# Patient Record
Sex: Female | Born: 1935 | Race: White | Hispanic: No | Smoking: Former smoker
Health system: Southern US, Community
[De-identification: ages and names within clinical notes are randomized; demographics above are authoritative.]

## PROBLEM LIST (undated history)

## (undated) DIAGNOSIS — Z972 Presence of dental prosthetic device (complete) (partial): Secondary | ICD-10-CM

## (undated) DIAGNOSIS — I10 Essential (primary) hypertension: Secondary | ICD-10-CM

## (undated) DIAGNOSIS — I4891 Unspecified atrial fibrillation: Secondary | ICD-10-CM

## (undated) DIAGNOSIS — Z9621 Cochlear implant status: Secondary | ICD-10-CM

## (undated) DIAGNOSIS — I495 Sick sinus syndrome: Secondary | ICD-10-CM

## (undated) DIAGNOSIS — Z950 Presence of cardiac pacemaker: Secondary | ICD-10-CM

## (undated) HISTORY — PX: PACEMAKER INSERTION: SHX728

## (undated) HISTORY — PX: COCHLEAR IMPLANT: SUR684

## (undated) HISTORY — PX: ABDOMINAL HYSTERECTOMY: SHX81

---

## 2011-03-11 ENCOUNTER — Other Ambulatory Visit: Payer: Self-pay

## 2011-03-30 ENCOUNTER — Other Ambulatory Visit: Payer: Self-pay

## 2012-11-06 ENCOUNTER — Ambulatory Visit: Payer: Self-pay | Admitting: Nephrology

## 2013-05-06 ENCOUNTER — Inpatient Hospital Stay: Payer: Self-pay | Admitting: Internal Medicine

## 2013-05-06 LAB — URINALYSIS, COMPLETE
BILIRUBIN, UR: NEGATIVE
BLOOD: NEGATIVE
Glucose,UR: NEGATIVE mg/dL (ref 0–75)
Ketone: NEGATIVE
Nitrite: POSITIVE
Ph: 6 (ref 4.5–8.0)
Protein: 100
RBC,UR: NONE SEEN /HPF (ref 0–5)
Specific Gravity: 1.012 (ref 1.003–1.030)

## 2013-05-06 LAB — CBC
HCT: 34.9 % — AB (ref 35.0–47.0)
HGB: 11.6 g/dL — ABNORMAL LOW (ref 12.0–16.0)
MCH: 31 pg (ref 26.0–34.0)
MCHC: 33.2 g/dL (ref 32.0–36.0)
MCV: 93 fL (ref 80–100)
Platelet: 104 10*3/uL — ABNORMAL LOW (ref 150–440)
RBC: 3.74 10*6/uL — ABNORMAL LOW (ref 3.80–5.20)
RDW: 13.8 % (ref 11.5–14.5)
WBC: 6.1 10*3/uL (ref 3.6–11.0)

## 2013-05-06 LAB — BASIC METABOLIC PANEL
ANION GAP: 8 (ref 7–16)
BUN: 27 mg/dL — ABNORMAL HIGH (ref 7–18)
CHLORIDE: 99 mmol/L (ref 98–107)
Calcium, Total: 8.8 mg/dL (ref 8.5–10.1)
Co2: 27 mmol/L (ref 21–32)
Creatinine: 1.02 mg/dL (ref 0.60–1.30)
EGFR (African American): 56 — ABNORMAL LOW
EGFR (Non-African Amer.): 48 — ABNORMAL LOW
Glucose: 103 mg/dL — ABNORMAL HIGH (ref 65–99)
Osmolality: 274 (ref 275–301)
POTASSIUM: 4 mmol/L (ref 3.5–5.1)
Sodium: 134 mmol/L — ABNORMAL LOW (ref 136–145)

## 2013-05-06 LAB — HEMOGLOBIN A1C: Hemoglobin A1C: 5.2 % (ref 4.2–6.3)

## 2013-05-06 LAB — TROPONIN I: Troponin-I: <0.02 ng/mL

## 2013-05-06 LAB — TSH: Thyroid Stimulating Horm: 0.661 u[IU]/mL

## 2013-05-07 LAB — COMPREHENSIVE METABOLIC PANEL
ALBUMIN: 2.6 g/dL — AB (ref 3.4–5.0)
ALK PHOS: 38 U/L — AB
ALT: 9 U/L — AB (ref 12–78)
ANION GAP: 4 — AB (ref 7–16)
AST: 19 U/L (ref 15–37)
BUN: 24 mg/dL — ABNORMAL HIGH (ref 7–18)
Bilirubin,Total: 0.5 mg/dL (ref 0.2–1.0)
CALCIUM: 8.6 mg/dL (ref 8.5–10.1)
CHLORIDE: 103 mmol/L (ref 98–107)
CO2: 27 mmol/L (ref 21–32)
Creatinine: 0.83 mg/dL (ref 0.60–1.30)
EGFR (African American): >60
Glucose: 133 mg/dL — ABNORMAL HIGH (ref 65–99)
Osmolality: 274 (ref 275–301)
Potassium: 4.1 mmol/L (ref 3.5–5.1)
SODIUM: 134 mmol/L — AB (ref 136–145)
Total Protein: 6.2 g/dL — ABNORMAL LOW (ref 6.4–8.2)

## 2013-05-07 LAB — PROTIME-INR
INR: 6.1
INR: 5.1 — AB
Prothrombin Time: 52.2 secs — ABNORMAL HIGH (ref 11.5–14.7)
Prothrombin Time: 45.4 secs — ABNORMAL HIGH (ref 11.5–14.7)

## 2013-05-07 LAB — CBC WITH DIFFERENTIAL/PLATELET
BASOS PCT: 0.4 %
Basophil #: 0 10*3/uL (ref 0.0–0.1)
Eosinophil #: 0 10*3/uL (ref 0.0–0.7)
Eosinophil %: 0 %
HCT: 32.5 % — ABNORMAL LOW (ref 35.0–47.0)
HGB: 10.9 g/dL — ABNORMAL LOW (ref 12.0–16.0)
Lymphocyte #: 0.3 10*3/uL — ABNORMAL LOW (ref 1.0–3.6)
Lymphocyte %: 7.1 %
MCH: 31.2 pg (ref 26.0–34.0)
MCHC: 33.6 g/dL (ref 32.0–36.0)
MCV: 93 fL (ref 80–100)
Monocyte #: 0.2 x10 3/mm (ref 0.2–0.9)
Monocyte %: 4.7 %
Neutrophil #: 3.7 10*3/uL (ref 1.4–6.5)
Neutrophil %: 87.8 %
PLATELETS: 94 10*3/uL — AB (ref 150–440)
RBC: 3.5 10*6/uL — AB (ref 3.80–5.20)
RDW: 13.7 % (ref 11.5–14.5)
WBC: 4.2 10*3/uL (ref 3.6–11.0)

## 2013-05-07 LAB — CK-MB
CK-MB: 0.5 ng/mL (ref 0.5–3.6)
CK-MB: 0.6 ng/mL (ref 0.5–3.6)
CK-MB: 0.7 ng/mL (ref 0.5–3.6)

## 2013-05-07 LAB — TROPONIN I
Troponin-I: <0.02 ng/mL
Troponin-I: <0.02 ng/mL
Troponin-I: <0.02 ng/mL

## 2013-05-08 LAB — PROTIME-INR
INR: 2.5
PROTHROMBIN TIME: 26.4 s — AB (ref 11.5–14.7)

## 2013-05-08 LAB — URINE CULTURE

## 2013-05-09 LAB — PROTIME-INR
INR: 2
Prothrombin Time: 22.1 secs — ABNORMAL HIGH (ref 11.5–14.7)

## 2013-05-10 LAB — PROTIME-INR
INR: 2.9
PROTHROMBIN TIME: 29.6 s — AB (ref 11.5–14.7)

## 2013-05-17 ENCOUNTER — Emergency Department: Payer: Self-pay | Admitting: Emergency Medicine

## 2013-05-17 LAB — BASIC METABOLIC PANEL
Anion Gap: 6 — ABNORMAL LOW (ref 7–16)
BUN: 37 mg/dL — ABNORMAL HIGH (ref 7–18)
CALCIUM: 8 mg/dL — AB (ref 8.5–10.1)
Chloride: 108 mmol/L — ABNORMAL HIGH (ref 98–107)
Co2: 25 mmol/L (ref 21–32)
Creatinine: 1.29 mg/dL (ref 0.60–1.30)
EGFR (African American): 42 — ABNORMAL LOW
GFR CALC NON AF AMER: 36 — AB
GLUCOSE: 117 mg/dL — AB (ref 65–99)
Osmolality: 287 (ref 275–301)
Potassium: 4.1 mmol/L (ref 3.5–5.1)
SODIUM: 139 mmol/L (ref 136–145)

## 2013-05-17 LAB — PROTIME-INR: PROTHROMBIN TIME: 106.9 s — AB (ref 11.5–14.7)

## 2013-05-17 LAB — CBC WITH DIFFERENTIAL/PLATELET
BASOS ABS: 0.1 10*3/uL (ref 0.0–0.1)
Basophil %: 0.5 %
EOS PCT: 1.7 %
Eosinophil #: 0.2 10*3/uL (ref 0.0–0.7)
HCT: 37.2 % (ref 35.0–47.0)
HGB: 12.4 g/dL (ref 12.0–16.0)
LYMPHS ABS: 0.8 10*3/uL — AB (ref 1.0–3.6)
LYMPHS PCT: 8 %
MCH: 31.2 pg (ref 26.0–34.0)
MCHC: 33.3 g/dL (ref 32.0–36.0)
MCV: 94 fL (ref 80–100)
MONOS PCT: 12.5 %
Monocyte #: 1.2 x10 3/mm — ABNORMAL HIGH (ref 0.2–0.9)
NEUTROS ABS: 7.6 10*3/uL — AB (ref 1.4–6.5)
Neutrophil %: 77.3 %
Platelet: 172 10*3/uL (ref 150–440)
RBC: 3.96 10*6/uL (ref 3.80–5.20)
RDW: 14.5 % (ref 11.5–14.5)
WBC: 9.9 10*3/uL (ref 3.6–11.0)

## 2013-05-17 LAB — APTT: ACTIVATED PTT: 78 s — AB (ref 23.6–35.9)

## 2014-06-18 ENCOUNTER — Emergency Department: Payer: Self-pay | Admitting: Emergency Medicine

## 2014-07-20 NOTE — Discharge Summary (Signed)
PATIENT NAME:  Brandi Aguirre, Brandi Aguirre MR#:  [id number] DATE OF BIRTH:  Jan 11, 1922  DATE OF ADMISSION:  05/06/2013 DATE OF DISCHARGE:  05/10/2013  ADMITTING DIAGNOSES:  1.  Acute respiratory failure.  2.  Malignant hypertension.   DISCHARGE DIAGNOSES: 1.  Acute respiratory failure.  2.  Chronic obstructive pulmonary disease exacerbation.  3.  Bronchitis.  4.  Malnutrition.  5.  Generalized weakness.  6. Coagulopathy, acquired due to Coumadin and antibiotic use with most recent INR on 05/09/2013, of 2.9.  7.  History of hypertension, coronary artery disease, diabetes mellitus with hemoglobin A1c 5.2. 8.  History of former tobacco abuse, permanent pacemaker placement, hyperlipidemia, insomnia, questionable atrial fibrillation.  9.  Status post carotid endarterectomy, hysterectomy and cochlear implant.   DISCHARGE CONDITION: Stable.   DISCHARGE MEDICATIONS: The patient is to resume:  1.  Lofibra 134 mg p.o. daily.  2.  Hydrochlorothiazide 25 mg p.o. daily.  3.  Metformin 500 mg p.o. twice daily.  4.  Metoprolol  100 mg p.o. twice daily. 5.  Warfarin 5 mg with additional 1/2 tablet, which would be 7.5 mg total for Mondays, Wednesdays and Fridays; 5 mg all other days, which would be on Tuesdays, Thursdays, Saturdays and Sundays.  5.  Diltiazem extended release 240 mg p.o. twice a day.  6.  Trazodone 100 mg p.o. once daily at bedtime.  7.  Hydralazine 100 mg p.o. 3 times daily.  8.  Isosorbide mononitrate 60 mg p.o. once daily.  9.  Lisinopril 40 mg p.o. daily.  10.  Prednisone 40 mg p.o. once, then taper by 10 mg daily until stopped.  11.  Eszopiclone 2 mg p.o. at bedtime as needed.  12.  Albuterol/ipratropium 2.5/0.5 mg in 3 mL inhalation solution, 1 inhaler every 6 hours as needed.  13.  Fluticasone/formoterol 250/50, 1 puff twice daily.  14.  Senna 1 tablet twice daily as needed.  15.  Docusate sodium 100 mg p.o. twice daily as needed.  16.  Levofloxacin 750 mg p.o. every 48 hours x 3 days.    HOME OXYGEN: None.   DIET: Two-gram salt, low-fat, low-cholesterol, carbohydrate-controlled diet, regular consistency.   ACTIVITY LIMITATIONS: As tolerated.     FOLLOWUP: Appointment with Dr. Zada Finderslmedo n 2 days after discharge. Also, Dr. Belia HemanKasa, pulmonary.   REFERRALS: To physical therapy.   CONSULTANTS: Care management, social work.   RADIOLOGIC STUDIES: Chest x-ray, PA and lateral, 0208/2015, showed nodular opacity in the right perihilar region, which warrants noncontrast enhanced CT of chest to assess it.  CT scan of chest without IV contrast, 05/06/2013, revealed no suspicious pulmonary nodularity. There was small subpleural ground-glass density in left upper lobe of doubtful clinical significance, according to radiologist. Also diffuse atherosclerosis, but no acute findings demonstrated.   HOSPITAL COURSE: The patient is a 81 year old Caucasian female with past medical history significant for history of multiple medical problems including diabetes, hypertension, Afib, who presented to the hospital with significant fatigue, as well as dry cough and shortness of breath. Please refer to Dr. Arlys JohnVaickute's admission note on 05/06/2013. On arrival to the Emergency Room, the patient's temperature was 98.7, pulse was 60, respirations were 24, blood pressure 162/97. O2 sats were 88% on room air at rest. Physical exam revealed dry crackles bilaterally, but no significant rales, rhonchi or wheezing noted. No labored inspirations or increased effort.  No  dullness to percussion, not in overt respiratory distress. The patient's lab data done in the Emergency Room revealed mildly low sodium at 134 and elevation  of BUN to 27, glucose of 103. The patient's hemoglobin A1c was checked and was found to be 5.2. Liver enzymes were unremarkable, except albumin level of 2.6. Cardiac enzymes x 4 were within normal limits. TSH was normal at 0.661. White blood cell count was normal at 6.1. Hemoglobin was 11.6 and platelet  count was 104. Coagulation panel revealed pro time of 52.2, INR was 6.1 and d-dimer was elevated at 1.46. Urinalysis revealed 5 to 15 white blood cells. Urine culture, however, revealed mixed bacterial organisms, results suggestive of contamination.  EKG showed left ventricular pacemaker at 60 beats per minute. No acute ST-T changes were noted. The patient was admitted to the hospital for further evaluation. She was hydrated. She was initiated on antibiotics, steroids, inhalation therapy for her shortness of breath because of concern of possible COPD exacerbation and pneumonia. She was seen by Dr. Belia Heman, pulmonologist, who followed her along. Dr. Belia Heman felt that very likely the patient had bronchitis with probable COPD exacerbation. He also felt that if cough persists more than for several weeks, we may need to find an alternative for ACE inhibitor. He recommended to continue oxygen therapy to keep oxygenation above 88%, continue Advair, albuterol, check for influenza testing and wean off steroids. The patient was continued on this current therapy, including Advair, nebulizers, antibiotic therapy and steroid taper. She did well, and she was weaned off oxygen therapy. By the day of discharge, 05/10/2013, her O2 sats were 93% on room air at rest. The patient is to continue steroid taper, as well as antibiotic therapy to complete a 7 day course. She is to follow up with her primary care physician for further recommendations. In regards to hypertension, coronary artery disease, they were stable. The patient is to continue her outpatient management. For diabetes mellitus, the patient's hemoglobin A1c is low at 5.2. It is recommended to follow her blood glucose levels very closely, as well as her oral intake, since she is back on Glucophage. The patient would benefit from nutritional supplements overall due to her malnutrition. In regards to weakness, the patient was evaluated by rehabilitation, by rehab physical therapist,  who recommended to discharge her to skilled nursing facility for rehabilitation. The patient will be discharged to [facility] according to her request today on 05/10/2013. On the day of discharge, temperature is 98.8, pulse was 62, respiration rate was 18 to 20, blood pressure ranging from 118 to 148 systolic and 50s to 60s diastolic. O2 sats were 92% to 93% on room air at rest.   TIME SPENT: 40 minutes.   Again, the patient's pro time was checked on 05/10/2013, was 2.9.   ____________________________ Katharina Caper, MD rv:dmm D: 05/10/2013 12:48:00 ET T: 05/10/2013 13:27:08 ET JOB#: [id number]  cc: Katharina Caper, MD, <Dictator> Dione Housekeeper, MD Kearney Evitt MD ELECTRONICALLY SIGNED 05/29/2013 21:13

## 2014-07-20 NOTE — H&P (Signed)
PATIENT NAME:  Brandi PoagWINNER, Brandi Aguirre MR#:  [id number] DATE OF BIRTH:  [birth year]  DATE OF ADMISSION:  05/06/2013  PRIMARY CARE PHYSICIAN: Dr.   The patient is a 81 years old Caucasian female with past medical history significant for history of hypertension, history of diabetes, questionable atrial fibrillation, who presented to the hospital with complaints of increasing fatigue over the past one week, as well as dry cough, as well as shortness of breath. On arrival to the Emergency Room, she was noted to have oxygen saturation of 88% on room air and she was also noted to be wheezing. Patient admitted not lifting up much   phlegm; had no fevers or chills. In the Emergency Room her chest x-ray was somewhat concerning for possible nodular opacity in the right perihilar region and CT scan of chest was performed; however, that did not reveal any abnormalities in the right perihilar area, but showed small subdural groundglass density in left upper lobe. Since the patient was hypoxic, hospital services were contacted for admission.   PAST MEDICAL HISTORY: Significant for history of former tobacco abuse, hypertension, permanent pacemaker placement, history of hyperlipidemia, diabetes mellitus, insomnia for which she takes trazodone, questionable atrial fibrillation carotid endarterectomy, hysterectomy, also cochlear implant. Diabetes mellitus as mentioned above.   MEDICATIONS: Diltiazem 240 mg p.o. twice daily, fenofibrate 134 mg once daily, hydrochlorothiazide 25 mg p.o. daily, lisinopril 40 mg p.o. daily, metformin 500 mg p.o. twice daily, metoprolol tartrate 100 mg p.o. twice daily, trazodone 100 mg p.o. in the evening, warfarin alternating doses 7.5 mg given on Wednesdays as well as Fridays and 5 mg all other days. Hydralazine 100 mg 3 times daily and Imdur 60 mg p.o. daily.   ALLERGIES: SULFA AS WELL AS NORVASC WHICH GIVES HER LOWER EXTREMITY SWELLING.  PAST SURGICAL HISTORY:  As above, others are not available.  Also cochlea implant. Very little is available since the patient has difficulty hearing.   FAMILY HISTORY: Hypertension, hyperlipidemia as well as diabetes mellitus in the patient's son. The patient's father died at age of 79. The patient's mother had arthritis.   SOCIAL HISTORY: The patient is widowed since 2012.  [place], [place] to [place]. No smoking. No alcohol abuse;  however, the patient did smoke in the past 2 packs per day for which she says forever since teenage years, quit in 1987. 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   REVIEW OF SYSTEMS: Difficult to obtain as the patient has very poor hearing. She denies any chest pains. Admits of shortness of breath, as well as cough and denies any abdominal pains.   PHYSICAL EXAMINATION: VITAL SIGNS: On arrival to the Emergency Room, the patient's temperature is 98.7, pulse was 60, respirations were 24, blood pressure 162/97, saturation was 88% on room air. IN GENERAL:  This is a well-developed, well-nourished Caucasian female in no significant distress lying on the stretcher.  HEENT: Pupils are equal, reactive to light. Extraocular movements intact. The patient has significant difficulty hearing. She has cochlear implant. No conjunctivitis. No pharyngeal erythema. Mucosa is very dry. Lips are scaled.  NECK: There is no masses. Supple, nontender. Thyroid is not enlarged. No adenopathy. No JVD or carotid bruits bilaterally. Full range of motion.  LUNGS: Dry crackles were heard bilaterally. No significant rales, rhonchi or wheezing were noted. No labored inspirations, increased effort. No dullness to percussion. Not in overt respiratory distress.  CARDIOVASCULAR: S1, S2 appreciable, cracles were  noted on the right. Chest is nontender to palpation.  EXTREMITIES: No lower extremity edema, calf tenderness or cyanosis was noted.  ABDOMEN: Soft, nontender.  Bowel sounds are present. No hepatosplenomegaly or masses were noted.  RECTAL: Deferred.  MUSCLE STRENGTH: Able to move all extremities. No cyanosis, degenerative joint disease. The patient does have kyphosis and very weak. She is not able to get up from the lying position to sitting position.  SKIN: She has no rashes, lesions, erythema, nodularity or induration. Was warm and dry to palpation.  LYMPHATIC: No adenopathy in the cervical region.  NEUROLOGIC: Cranial nerves grossly intact except hearing loss. Sensory otherwise is intact. No dysarthria or aphasia. The patient is alert, oriented to person and place as well as time. She is cooperative. Memory is good.  PSYCHIATRIC: No significant confusion, agitation or depression was noted.   LABORATORIES: BMP showed elevation of BUN to 27, sodium 134, glucose 103, otherwise BMP was unremarkable. Troponin level less than 0.02. White blood cell count was normal at 6.1, hemoglobin 11.6, platelet count 104. Urinalysis revealed yellow hazy urine, negative for glucose, bilirubin or ketones. Specific gravity was 1.012, pH was 6.0, negative for blood, 100 mg/dL protein, positive for nitrites, trace leukocyte esterase, no red blood cells, 5 to 15 white blood cells, 4+ bacteria, 5 to 15 epithelial cells, as well as 0 to 5 transitional epithelial cells.   Chest  x-rays PA and lateral  revealed nodular opacity in the right perihilar region, for which was noncontrast enhanced CT of chest for further evaluation recommended by radiology, otherwise clear lungs everywhere else. Extensive atherosclerotic changes were noted in the aorta according to radiologist. CT scan of the chest without contrast, 05/06/2013 reveals no suspicious pulmonary nodularity, subpleural groundglass density in the left upper lobe of doubtful clinical significance, diffuse atherosclerosis. No acute findings were demonstrated.   ASSESSMENT AND PLAN: 1. Hypoxia of unclear etiology at this time,  questionable pneumonia. We will initiate the patient on Levaquin IV, as well as steroids and DuoNeb. Due to her extensive history of smoking we will get a pulmonary consultation as well. We will get d-dimer and get CT scan with contrast if d-dimer is up, or  VQ scan.  2. Hypertension. We will resume blood pressure medications.  3. Diabetes mellitus will get hemoglobin A1c. We will continue home medications.  4. Mild dehydration. We will start the patient on low rate IV fluids.  5. Pyuria, likley urinary tract infection. Get cultures and continue Levaquin.  6. Generalized weakness. Continue the patient with physical therapy.  TIME SPENT: 50 minutes.    ____________________________ Katharina Caper, MD rv:sg D: 05/06/2013 22:06:00 ET T: 05/07/2013 07:33:09 ET JOB#: [id number]  cc: Katharina Caper, MD, <Dictator>    Raynee Mccasland MD ELECTRONICALLY SIGNED 05/30/2013 20:57

## 2015-09-04 ENCOUNTER — Inpatient Hospital Stay
Admission: EM | Admit: 2015-09-04 | Discharge: 2015-09-07 | DRG: 291 | Disposition: A | Discharge status: Home / Self Care | Payer: Medicare Other | Attending: Internal Medicine | Admitting: Internal Medicine

## 2015-09-04 ENCOUNTER — Emergency Department: Payer: Medicare Other

## 2015-09-04 ENCOUNTER — Inpatient Hospital Stay
Admit: 2015-09-04 | Discharge: 2015-09-04 | Disposition: A | Discharge status: Home / Self Care | Payer: Medicare Other | Attending: Family Medicine | Admitting: Family Medicine

## 2015-09-04 DIAGNOSIS — Z87891 Personal history of nicotine dependence: Secondary | ICD-10-CM | POA: Diagnosis not present

## 2015-09-04 DIAGNOSIS — J9601 Acute respiratory failure with hypoxia: Secondary | ICD-10-CM | POA: Diagnosis present

## 2015-09-04 DIAGNOSIS — I4891 Unspecified atrial fibrillation: Secondary | ICD-10-CM

## 2015-09-04 DIAGNOSIS — Z950 Presence of cardiac pacemaker: Secondary | ICD-10-CM

## 2015-09-04 DIAGNOSIS — I509 Heart failure, unspecified: Secondary | ICD-10-CM | POA: Diagnosis present

## 2015-09-04 DIAGNOSIS — H9190 Unspecified hearing loss, unspecified ear: Secondary | ICD-10-CM | POA: Diagnosis present

## 2015-09-04 DIAGNOSIS — I5041 Acute combined systolic (congestive) and diastolic (congestive) heart failure: Secondary | ICD-10-CM | POA: Diagnosis present

## 2015-09-04 DIAGNOSIS — Z79899 Other long term (current) drug therapy: Secondary | ICD-10-CM

## 2015-09-04 DIAGNOSIS — I248 Other forms of acute ischemic heart disease: Secondary | ICD-10-CM | POA: Diagnosis present

## 2015-09-04 DIAGNOSIS — I959 Hypotension, unspecified: Secondary | ICD-10-CM | POA: Diagnosis present

## 2015-09-04 DIAGNOSIS — E785 Hyperlipidemia, unspecified: Secondary | ICD-10-CM | POA: Diagnosis present

## 2015-09-04 DIAGNOSIS — Z9621 Cochlear implant status: Secondary | ICD-10-CM | POA: Diagnosis present

## 2015-09-04 DIAGNOSIS — I10 Essential (primary) hypertension: Secondary | ICD-10-CM

## 2015-09-04 DIAGNOSIS — Z7901 Long term (current) use of anticoagulants: Secondary | ICD-10-CM

## 2015-09-04 DIAGNOSIS — I110 Hypertensive heart disease with heart failure: Secondary | ICD-10-CM | POA: Diagnosis present

## 2015-09-04 LAB — BASIC METABOLIC PANEL
ANION GAP: 8 (ref 5–15)
BUN: 31 mg/dL — AB (ref 6–20)
CALCIUM: 8.6 mg/dL — AB (ref 8.9–10.3)
CO2: 26 mmol/L (ref 22–32)
Chloride: 101 mmol/L (ref 101–111)
Creatinine, Ser: 1.01 mg/dL — ABNORMAL HIGH (ref 0.44–1.00)
GFR calc Af Amer: 54 mL/min — ABNORMAL LOW (ref >60)
GFR, EST NON AFRICAN AMERICAN: 47 mL/min — AB (ref >60)
GLUCOSE: 203 mg/dL — AB (ref 65–99)
POTASSIUM: 4.4 mmol/L (ref 3.5–5.1)
SODIUM: 135 mmol/L (ref 135–145)

## 2015-09-04 LAB — CBC
HCT: 32.6 % — ABNORMAL LOW (ref 35.0–47.0)
HEMOGLOBIN: 10.9 g/dL — AB (ref 12.0–16.0)
MCH: 30.3 pg (ref 26.0–34.0)
MCHC: 33.4 g/dL (ref 32.0–36.0)
MCV: 90.9 fL (ref 80.0–100.0)
Platelets: 149 10*3/uL — ABNORMAL LOW (ref 150–440)
RBC: 3.59 MIL/uL — ABNORMAL LOW (ref 3.80–5.20)
RDW: 13.9 % (ref 11.5–14.5)
WBC: 9.8 10*3/uL (ref 3.6–11.0)

## 2015-09-04 LAB — PROTIME-INR
INR: 2.12
Prothrombin Time: 23.6 seconds — ABNORMAL HIGH (ref 11.4–15.0)

## 2015-09-04 LAB — TROPONIN I
TROPONIN I: 0.28 ng/mL — AB (ref <0.031)
Troponin I: 1.38 ng/mL — ABNORMAL HIGH (ref <0.031)
Troponin I: 2.32 ng/mL — ABNORMAL HIGH (ref <0.031)
Troponin I: 3.29 ng/mL — ABNORMAL HIGH (ref <0.031)

## 2015-09-04 LAB — FIBRIN DERIVATIVES D-DIMER (ARMC ONLY): FIBRIN DERIVATIVES D-DIMER (ARMC): 991 — AB (ref 0–499)

## 2015-09-04 LAB — TSH: TSH: 2.093 u[IU]/mL (ref 0.350–4.500)

## 2015-09-04 MED ORDER — SENNOSIDES-DOCUSATE SODIUM 8.6-50 MG PO TABS
1.0000 | ORAL_TABLET | Freq: Every evening | ORAL | Status: DC | PRN
  Administered 2015-09-06: 1 via ORAL
  Filled 2015-09-04: qty 1

## 2015-09-04 MED ORDER — ENOXAPARIN SODIUM 40 MG/0.4ML ~~LOC~~ SOLN
40.0000 mg | SUBCUTANEOUS | Status: DC
  Administered 2015-09-04: 40 mg via SUBCUTANEOUS
  Filled 2015-09-04: qty 0.4

## 2015-09-04 MED ORDER — METOPROLOL TARTRATE 100 MG PO TABS
100.0000 mg | ORAL_TABLET | Freq: Two times a day (BID) | ORAL | Status: DC
Start: 2015-09-04 — End: 2015-09-06
  Administered 2015-09-04: 100 mg via ORAL
  Filled 2015-09-04 (×2): qty 1

## 2015-09-04 MED ORDER — ENSURE ENLIVE PO LIQD
237.0000 mL | Freq: Three times a day (TID) | ORAL | Status: DC
  Administered 2015-09-04 – 2015-09-07 (×8): 237 mL via ORAL

## 2015-09-04 MED ORDER — FUROSEMIDE 10 MG/ML IJ SOLN
INTRAMUSCULAR | Status: AC
  Administered 2015-09-04: 40 mg via INTRAVENOUS
  Filled 2015-09-04: qty 4

## 2015-09-04 MED ORDER — ONDANSETRON HCL 4 MG/2ML IJ SOLN: 4.0000 mg | Freq: Four times a day (QID) | INTRAMUSCULAR | Status: DC | PRN

## 2015-09-04 MED ORDER — DILTIAZEM HCL 60 MG PO TABS: 240.0000 mg | ORAL_TABLET | Freq: Every day | ORAL | Status: DC

## 2015-09-04 MED ORDER — IPRATROPIUM-ALBUTEROL 0.5-2.5 (3) MG/3ML IN SOLN
RESPIRATORY_TRACT | Status: AC
  Administered 2015-09-04: 06:00:00
  Filled 2015-09-04: qty 3

## 2015-09-04 MED ORDER — LISINOPRIL 20 MG PO TABS
40.0000 mg | ORAL_TABLET | Freq: Every day | ORAL | Status: DC
  Administered 2015-09-04: 40 mg via ORAL
  Filled 2015-09-04: qty 2

## 2015-09-04 MED ORDER — ISOSORBIDE MONONITRATE ER 60 MG PO TB24
60.0000 mg | ORAL_TABLET | Freq: Every day | ORAL | Status: DC
  Administered 2015-09-04 – 2015-09-07 (×3): 60 mg via ORAL
  Filled 2015-09-04 (×3): qty 1

## 2015-09-04 MED ORDER — ACETAMINOPHEN 650 MG RE SUPP: 650.0000 mg | Freq: Four times a day (QID) | RECTAL | Status: DC | PRN

## 2015-09-04 MED ORDER — SODIUM CHLORIDE 0.9% FLUSH
3.0000 mL | Freq: Two times a day (BID) | INTRAVENOUS | Status: DC
  Administered 2015-09-04 – 2015-09-06 (×6): 3 mL via INTRAVENOUS

## 2015-09-04 MED ORDER — FUROSEMIDE 10 MG/ML IJ SOLN
40.0000 mg | Freq: Two times a day (BID) | INTRAMUSCULAR | Status: DC
  Administered 2015-09-04: 40 mg via INTRAVENOUS
  Filled 2015-09-04: qty 4

## 2015-09-04 MED ORDER — WARFARIN SODIUM 5 MG PO TABS: 5.0000 mg | ORAL_TABLET | Freq: Every day | ORAL | Status: DC

## 2015-09-04 MED ORDER — FUROSEMIDE 10 MG/ML IJ SOLN
40.0000 mg | Freq: Once | INTRAMUSCULAR | Status: AC
  Administered 2015-09-04: 40 mg via INTRAVENOUS

## 2015-09-04 MED ORDER — DILTIAZEM HCL ER COATED BEADS 240 MG PO CP24
240.0000 mg | ORAL_CAPSULE | Freq: Every day | ORAL | Status: DC
  Administered 2015-09-04 – 2015-09-07 (×3): 240 mg via ORAL
  Filled 2015-09-04 (×3): qty 1

## 2015-09-04 MED ORDER — TRAZODONE HCL 100 MG PO TABS
100.0000 mg | ORAL_TABLET | Freq: Every day | ORAL | Status: DC
  Administered 2015-09-04 – 2015-09-06 (×2): 100 mg via ORAL
  Filled 2015-09-04 (×4): qty 1

## 2015-09-04 MED ORDER — ENOXAPARIN SODIUM 60 MG/0.6ML ~~LOC~~ SOLN
1.0000 mg/kg | SUBCUTANEOUS | Status: DC
  Administered 2015-09-05: 55 mg via SUBCUTANEOUS
  Filled 2015-09-04: qty 0.6

## 2015-09-04 MED ORDER — HYDRALAZINE HCL 50 MG PO TABS
100.0000 mg | ORAL_TABLET | Freq: Three times a day (TID) | ORAL | Status: DC
  Administered 2015-09-04 – 2015-09-05 (×3): 100 mg via ORAL
  Filled 2015-09-04 (×4): qty 2

## 2015-09-04 MED ORDER — ONDANSETRON HCL 4 MG PO TABS: 4.0000 mg | ORAL_TABLET | Freq: Four times a day (QID) | ORAL | Status: DC | PRN

## 2015-09-04 MED ORDER — ACETAMINOPHEN 325 MG PO TABS: 650.0000 mg | ORAL_TABLET | Freq: Four times a day (QID) | ORAL | Status: DC | PRN

## 2015-09-04 MED ORDER — ASPIRIN EC 81 MG PO TBEC
81.0000 mg | DELAYED_RELEASE_TABLET | Freq: Every day | ORAL | Status: DC
  Administered 2015-09-04 – 2015-09-07 (×4): 81 mg via ORAL
  Filled 2015-09-04 (×4): qty 1

## 2015-09-04 NOTE — Progress Notes (Signed)
82yo wf admitted to room 246 via stretcher from ED with CHF exacerbation.  A&O x 3.  No distress on 2LO2 per .  Pt very HOH, has bilateral cochlear implants.  Cardiac monitor placed on pt and verified.  Denies chest pain.  Lungs diminished bil.  Abdomen benign. Skin assessment with bruises noted on bil arms and lt knee, skin checked with Crystal G. RN.  Oriented to room and surroundings, POC reviewed with pt and family.  Denies need at this time.  CB in reach, SR up x 2, bed alarm on.

## 2015-09-04 NOTE — H&P (Signed)
PCP:   [facility]   Chief Complaint:  Shortness of breath  HPI: This is a 82 year old female who states she woke up at midnight with sudden onset of shortness of breath. She states her SOB was rather marked and the only way she could get some relief was with standing. She denies any chest pains. She states she's never had this before. She denies any lower extremity edema. She states she's been coughing more and she has a mild wheeze. She states her cough is nonproductive. She is not on home oxygen. she has no history of myocardial infarction but she does have a pacemaker and a history of atrial fibrillation. She's never had episodes of congestive heart failure in the past. She called her family and they brought her to the ER. She feels much better after Lasix administration. Patient's is very hard of hearing.  Review of Systems:  The patient denies anorexia, fever, weight loss,, vision loss, decreased hearing, hoarseness, chest pain, syncope, dyspnea on exertion, peripheral edema, balance deficits, hemoptysis, abdominal pain, melena, hematochezia, severe indigestion/heartburn, hematuria, incontinence, genital sores, muscle weakness, suspicious skin lesions, transient blindness, difficulty walking, depression, unusual weight change, abnormal bleeding, enlarged lymph nodes, angioedema, and breast masses.  Past Medical History: History reviewed. No pertinent past medical history. Past Surgical History  Procedure Laterality Date  . Pacemaker insertion    . Cochlear implant      Medications: Prior to Admission medications   Not on File    Allergies:  No Known Allergies  Social History:  reports that she has quit smoking. She does not have any smokeless tobacco history on file. Her alcohol and drug histories are not on file.  Family History: History reviewed. No pertinent family history.  Physical Exam: Filed Vitals:   09/04/15 0540 09/04/15 0550 09/04/15 0621  BP:   130/67 143/66  Pulse:  78 82  Temp:  97.7 F (36.5 C)   TempSrc:  Oral   Resp:  19 17  SpO2: 86% 97% 93%    General:  Alert and oriented times three, well developed and nourished, no acute distress Eyes: PERRLA, pink conjunctiva, no scleral icterus ENT: Moist oral mucosa, neck supple, no thyromegaly Lungs: clear to ascultation, no wheeze, mild anterior crackles, no use of accessory muscles Cardiovascular: regular rate and rhythm, no regurgitation, no gallops, no murmurs. No carotid bruits, no JVD Abdomen: soft, positive BS, non-tender, non-distended, no organomegaly, not an acute abdomen GU: not examined Neuro: CN II - XII grossly intact, sensation intact Musculoskeletal: strength 5/5 all extremities, no clubbing, cyanosis or edema Skin: no rash, no subcutaneous crepitation, no decubitus Psych: appropriate patient   Labs on Admission:   Recent Labs  09/04/15 0538  NA 135  K 4.4  CL 101  CO2 26  GLUCOSE 203*  BUN 31*  CREATININE 1.01*  CALCIUM 8.6*   No results for input(s): AST, ALT, ALKPHOS, BILITOT, PROT, ALBUMIN in the last 72 hours. No results for input(s): LIPASE, AMYLASE in the last 72 hours.  Recent Labs  09/04/15 0538  WBC 9.8  HGB 10.9*  HCT 32.6*  MCV 90.9  PLT 149*    Recent Labs  09/04/15 0538  TROPONINI 0.28*   Invalid input(s): POCBNP No results for input(s): DDIMER in the last 72 hours. No results for input(s): HGBA1C in the last 72 hours. No results for input(s): CHOL, HDL, LDLCALC, TRIG, CHOLHDL, LDLDIRECT in the last 72 hours. No results for input(s): TSH, T4TOTAL, T3FREE, THYROIDAB in the  last 72 hours.  Invalid input(s): FREET3 No results for input(s): VITAMINB12, FOLATE, FERRITIN, TIBC, IRON, RETICCTPCT in the last 72 hours.  Micro Results: No results found for this or any previous visit (from the past 240 hour(s)).   Radiological Exams on Admission: Dg Chest Port 1 View  09/04/2015  CLINICAL DATA:  Dyspnea and hypoxia EXAM:  PORTABLE CHEST 1 VIEW COMPARISON:  06/18/2014 FINDINGS: Chronic cardiopericardial enlargement. Stable aortic and hilar contours. Dual-chamber pacer from the right in stable position. New diffuse interstitial opacity with Kerley lines. IMPRESSION: CHF pattern. Electronically Signed   By: Marnee Spring M.D.   On: 09/04/2015 06:12    Assessment/Plan Present on Admission:  New-onset congestive heart failure -Admit to med telemetry -IV Lasix every 12 hours -Strict I's and O's, daily weights -Place Foley -2-D echo ordered -Cardiology consult requested  Mild troponin elevation -Will order 325 mg of aspirin daily and cycle enzymes. DVT dose of Lovenox for now. We'll advance if troponins continued to decrease  -Consult cardiology. Patient denies any chest pains.  Hypertension -Stable, home medications resumed  History of atrial fibrillation -Stable, now in sinus rhythm  Dyslipidemia -Stable medications resumed   Brandi Aguirre 09/04/2015, 6:38 AM

## 2015-09-04 NOTE — Progress Notes (Signed)
Pt scheduled to get hydralalize & metoprolol for tonight, pt's BP was  Filed Vitals:   09/04/15 2001 09/04/15 2133  BP: 109/44 110/38  Pulse: 64 51  Temp: 98.8 F (37.1 C)   Resp: 18    MD paged to see whether to hold these meds or to give them, Dr. Joneen Roachrosley states to hold both medications. Will continue to monitor. Shirley FriarAlexis Miller, RN

## 2015-09-04 NOTE — Progress Notes (Signed)
Dr. Clint GuyHower made aware of pt's second troponin of 1.38

## 2015-09-04 NOTE — Plan of Care (Signed)
Problem: Safety: Goal: Ability to remain free from injury will improve Outcome: Progressing Fall precautions in place  Problem: Tissue Perfusion: Goal: Risk factors for ineffective tissue perfusion will decrease Outcome: Progressing Coumadin

## 2015-09-04 NOTE — Progress Notes (Signed)
Spoke with Dr. Lady GaryFath re: + troponins, will be up to see pt shortly.

## 2015-09-04 NOTE — Consult Note (Signed)
[facility] CLINIC CARDIOLOGY [facility]  CARDIOLOGY CONSULT NOTE  Patient ID: Brandi Aguirre MRN: [id number] DOB/AGE: [birth year] 80 y.o.  Admit date: 09/04/2015 Referring Physician Dr. Clint Guy Primary Physician   Primary Cardiologist   Reason for Consultation chf/abnormal troponin  HPI: Pt is a 82 yo female with history of sss with ppm and a fib anticoagulated with warfarin who was admitted after noting fairly rapid onset of sob. She was brought to the er where she was noted ot have pulmonary edema. Pt is very HOH and her family give most of the history. They state that the patient and her family went out for dinner to celebrate several birthdays in the family. During that encounter she said she didn't feel good but denied chest pain or profound sob. Early this morning, the caregivers at her place of residence called her family to state she was very short of breath. EKG in the er showed nsr with lvh. Her initial troponin was 0.28 and has risen to 2.32. She has iimproved with diuresis. Her renal funciton appears at her baseline and she has no peripheral edema. She denies chest pain.  Review of Systems  HENT: Positive for hearing loss.   Eyes: Negative.   Respiratory: Positive for cough and shortness of breath.   Cardiovascular: Negative.   Gastrointestinal: Positive for nausea.  Genitourinary: Negative.   Musculoskeletal: Negative.   Skin: Negative.   Neurological: Positive for weakness.  Endo/Heme/Allergies: Negative.   Psychiatric/Behavioral: Negative.     History reviewed. No pertinent past medical history.  History reviewed. No pertinent family history.  Social History   Social History  . Marital Status: Widowed    Spouse Name: N/A  . Number of Children: N/A  . Years of Education: N/A   Occupational History  . Not on file.   Social History Main Topics  . Smoking status: Former Games developer  . Smokeless tobacco: Not on file  . Alcohol Use: Not on file  . Drug Use: Not  on file  . Sexual Activity: Not on file   Other Topics Concern  . Not on file   Social History Narrative  . No narrative on file    Past Surgical History  Procedure Laterality Date  . Pacemaker insertion    . Cochlear implant       Prescriptions prior to admission  Medication Sig Dispense Refill Last Dose  . diltiazem (CARDIZEM) 120 MG tablet Take 2 tablets by mouth daily.  1 09/03/2015 at 0900  . hydrALAZINE (APRESOLINE) 100 MG tablet Take 100 mg by mouth 3 (three) times daily.   3 09/03/2015 at 2000  . hydrochlorothiazide (HYDRODIURIL) 25 MG tablet Take 25 mg by mouth daily.   09/03/2015 at 0900  . isosorbide mononitrate (IMDUR) 60 MG 24 hr tablet Take 60 mg by mouth daily.   0 09/03/2015 at 0900  . lisinopril (PRINIVIL,ZESTRIL) 40 MG tablet Take 40 mg by mouth daily.   0 09/03/2015 at 0900  . metoprolol (LOPRESSOR) 100 MG tablet Take 100 mg by mouth 2 (two) times daily.   0 09/03/2015 at 2000  . traZODone (DESYREL) 100 MG tablet Take 100 mg by mouth at bedtime.   0 09/03/2015 at 2000  . warfarin (COUMADIN) 4 MG tablet Take 6 mg by mouth daily. Pt. Takes on Friday, Saturday and Sunday.   08/31/2015 at Unknown time  . warfarin (COUMADIN) 5 MG tablet Take 5 mg by mouth daily. Pt. Takes on Monday, Tuesday, Wednesday and Thursday.  3 09/03/2015  at Unknown time    Physical Exam: Blood pressure 149/58, pulse 77, temperature 99.2 F (37.3 C), temperature source Oral, resp. rate 17, height 5\' 7"  (1.702 m), weight 53.298 kg (117 lb 8 oz), SpO2 92 %.   Wt Readings from Last 1 Encounters:  09/04/15 53.298 kg (117 lb 8 oz)     General appearance: cooperative Resp: rhonchi bibasilar Cardio: S1, S2 normal and systolic murmur: early systolic 2/6, crescendo and decrescendo at lower left sternal border GI: soft, non-tender; bowel sounds normal; no masses,  no organomegaly Extremities: extremities normal, atraumatic, no cyanosis or edema Neurologic: Grossly normal  Labs:   Lab Results  Component Value  Date   WBC 9.8 09/04/2015   HGB 10.9* 09/04/2015   HCT 32.6* 09/04/2015   MCV 90.9 09/04/2015   PLT 149* 09/04/2015    Recent Labs Lab 09/04/15 0538  NA 135  K 4.4  CL 101  CO2 26  BUN 31*  CREATININE 1.01*  CALCIUM 8.6*  GLUCOSE 203*   Lab Results  Component Value Date   CKMB 0.6 05/07/2013   TROPONINI 2.32* 09/04/2015      Radiology: pulmonary edema EKG: nsr with no ischemia  ASSESSMENT AND PLAN:  82 yo female with history of afib, currently in nsr, history of bradycardia, with ppm who was admitted with fairly rapid onset pulmonary edema. She has iimporved with diuresis. Her serum troponin has risen to 2.32. She denies chest pain. No injury current on ekg. Pt does not want any invsive evaluation per previous discussion with her family. Etiology of the elevated troponin is likely multifactoral to include demand ischemia vs possible cad. Would continue with current meds including imdur at 60, metoprolol 100 bid, lisinorpil and hydralazine. Continue with careful diuresis. Will review echo when avaiable. Signed: Dalia HeadingFATH,Bereket Gernert A. MD, [facility] 09/04/2015, 5:27 PM

## 2015-09-04 NOTE — ED Notes (Addendum)
Pt family left to go get list of medicines.Pt informed about lasix by writing.

## 2015-09-04 NOTE — Progress Notes (Signed)
Patient admitted this morning, given increase in troponin will change to therapeutic dosing of Lovenox Of note she is to therapeutic on her warfarin INR of 2.1 and already received a dose of Lovenox this morning Hold warfarin no further anticoagulation needed today Pharmacy to continue to assist in dosing

## 2015-09-04 NOTE — ED Notes (Addendum)
Pt presents to ED via [facility]. [facility] She pushed her medic alert button because she couldn't breath. EMS sat her up and she could breath better. Her 02 sat was 82% on RA, EMS placed her on 3L [place]. Pt does not wear oxygen at home. Pt is hard of hearing, she has a hearing aid with her that does not work well. To communicate, EMS wrote out what they wanted to say. Pt has a pacemaker, EMS stated that it did not fire. Pt states no fever, chest pain, belly pain, or CHF. Pt told EMS she had some nausea. EMS original vitals 82% on RA, 38 respirations. EMS placed pt on 3L [place] which brought pt up to 96%. BP 156/88, CO2 35.

## 2015-09-04 NOTE — Progress Notes (Signed)
Dr. Lady GaryFath made aware of pt's elevated troponin.  No new orders received.

## 2015-09-04 NOTE — ED Provider Notes (Signed)
[facility] Emergency Department Provider Note  ____________________________________________  Time seen: 5:30 AM  I have reviewed the triage vital signs and the nursing notes.   HISTORY  Chief Complaint Shortness of Breath     HPI Brandi Aguirre is a 82 y.o. female with history of hypertension and atrial fibrillation pacemaker/defibrillator presents via [facility] from wide-open manner after hitting her medic alert button secondary to difficulty breathing. Per EMS on their arrival the patient's oxygen sat was 82% on room air. Patient states difficulty breathing is worse when laying flat. Patient was placed on 3 L nasal cannula via EMS with improvement of symptoms. Patient denies any chest pain.     History reviewed. No pertinent past medical history.  Patient Active Problem List   Diagnosis Date Noted  . CHF (congestive heart failure) (HCC) 09/04/2015  . HTN (hypertension) 09/04/2015  . Atrial fibrillation (HCC) 09/04/2015  . Dyslipidemia 09/04/2015  . HOH (hard of hearing) 09/04/2015    Past Surgical History  Procedure Laterality Date  . Pacemaker insertion    . Cochlear implant      No current outpatient prescriptions on file.  Allergies No known drug allergies  History reviewed. No pertinent family history.  Social History Social History  Substance Use Topics  . Smoking status: Former Games developer  . Smokeless tobacco: None  . Alcohol Use: None    Review of Systems  Constitutional: Negative for fever. Eyes: Negative for visual changes. ENT: Negative for sore throat. Cardiovascular: Negative for chest pain. Respiratory: Positive for shortness of breath. Gastrointestinal: Negative for abdominal pain, vomiting and diarrhea. Genitourinary: Negative for dysuria. Musculoskeletal: Negative for back pain. Skin: Negative for rash. Neurological: Negative for headaches, focal weakness or numbness.   10-point ROS otherwise  negative.  ____________________________________________   PHYSICAL EXAM:  VITAL SIGNS: ED Triage Vitals  Enc Vitals Group     BP 09/04/15 0550 130/67 mmHg     Pulse Rate 09/04/15 0550 78     Resp 09/04/15 0550 19     Temp 09/04/15 0550 97.7 F (36.5 C)     Temp Source 09/04/15 0550 Oral     SpO2 09/04/15 0540 86 %     Weight --      Height --      Head Cir --      Peak Flow --      Pain Score 09/04/15 0540 0     Pain Loc --      Pain Edu? --      Excl. in GC? --     Constitutional: Alert and oriented. Well appearing and in no distress. Eyes: Conjunctivae are normal. PERRL. Normal extraocular movements. ENT   Head: Normocephalic and atraumatic.   Nose: No congestion/rhinnorhea.   Mouth/Throat: Mucous membranes are moist.   Neck: No stridor. Hematological/Lymphatic/Immunilogical: No cervical lymphadenopathy. Cardiovascular: Normal rate, regular rhythm. Normal and symmetric distal pulses are present in all extremities. No murmurs, rubs, or gallops. Respiratory: Tachypnea with accessory respiratory muscle use Breath sounds are clear and equal bilaterally. Bibasilar rales Gastrointestinal: Soft and nontender. No distention. There is no CVA tenderness. Genitourinary: deferred Musculoskeletal: Nontender with normal range of motion in all extremities. No joint effusions.  No lower extremity tenderness nor edema. Neurologic:  Normal speech and language. No gross focal neurologic deficits are appreciated. Speech is normal.  Skin:  Skin is warm, dry and intact. No rash noted. Psychiatric: Mood and affect are normal. Speech and behavior are normal. Patient exhibits appropriate insight  and judgment.  ____________________________________________    LABS (pertinent positives/negatives)  Labs Reviewed  BASIC METABOLIC PANEL - Abnormal; Notable for the following:    Glucose, Bld 203 (*)    BUN 31 (*)    Creatinine, Ser 1.01 (*)    Calcium 8.6 (*)    GFR calc non Af  Amer 47 (*)    GFR calc Af Amer 54 (*)    All other components within normal limits  CBC - Abnormal; Notable for the following:    RBC 3.59 (*)    Hemoglobin 10.9 (*)    HCT 32.6 (*)    Platelets 149 (*)    All other components within normal limits  TROPONIN I - Abnormal; Notable for the following:    Troponin I 0.28 (*)    All other components within normal limits  FIBRIN DERIVATIVES D-DIMER ([facility] ONLY) - Abnormal; Notable for the following:    Fibrin derivatives D-dimer ([facility]) 991 (*)    All other components within normal limits    ____________________________________________   EKG  ED ECG REPORT I, [place] N BROWN, the attending physician, personally viewed and interpreted this ECG.   Date: 09/04/2015  EKG Time: 5:27 AM  Rate: 83  Rhythm: Normal sinus rhythm  Axis: Normal  Intervals: Normal  ST&T Change: None   ____________________________________________    RADIOLOGY  DG Chest Port 1 View (Final result) Result time: 09/04/15 06:12:54   Final result by Rad Results In Interface (09/04/15 06:12:54)   Narrative:   CLINICAL DATA: Dyspnea and hypoxia  EXAM: PORTABLE CHEST 1 VIEW  COMPARISON: 06/18/2014  FINDINGS: Chronic cardiopericardial enlargement. Stable aortic and hilar contours. Dual-chamber pacer from the right in stable position. New diffuse interstitial opacity with Kerley lines.  IMPRESSION: CHF pattern.   Electronically Signed By: Marnee SpringJonathon Watts M.D. On: 09/04/2015 06:12         INITIAL IMPRESSION / ASSESSMENT AND PLAN / ED COURSE  Pertinent labs & imaging results that were available during my care of the patient were reviewed by me and considered in my medical decision making (see chart for details).  Lasix 40 mg given,Patient discussed with Dr. Tobi BastosPyreddy for hospital admission  ____________________________________________   FINAL CLINICAL IMPRESSION(S) / ED DIAGNOSES  Final diagnoses:  Acute congestive heart failure,  unspecified congestive heart failure type [facility](HCC)      Darci Currentandolph N Brown, MD 09/05/15 [phone]

## 2015-09-04 NOTE — Progress Notes (Signed)
ANTICOAGULATION CONSULT NOTE - Initial Consult  Pharmacy Consult for Enoxaparin Indication: chest pain/ACS  No Known Allergies  Patient Measurements: Height: 5\' 7"  (170.2 cm) Weight: 117 lb 8 oz (53.298 kg) (admission) IBW/kg (Calculated) : 61.6   Vital Signs: Temp: 99.2 F (37.3 C) (06/08 1142) Temp Source: Oral (06/08 1142) BP: 142/68 mmHg (06/08 1142) Pulse Rate: 94 (06/08 1142)  Labs:  Recent Labs  09/04/15 0538 09/04/15 1003  HGB 10.9*  --   HCT 32.6*  --   PLT 149*  --   LABPROT 23.6*  --   INR 2.12  --   CREATININE 1.01*  --   TROPONINI 0.28* 1.38*    Estimated Creatinine Clearance: 29.9 mL/min (by C-G formula based on Cr of 1.01).  Assessment: 82 yo female on warfarin for AFib prior to admission now with troponins trending up. Orders to start on enoxaparin.   Add-on INR this AM 2.12 Pt received enoxaparin 40 mg SQ at 1028 (prophylactic dose)  Goal of Therapy:  Monitor platelets by anticoagulation protocol: Yes   Plan:  Pt's dose would be enoxaparin 55 mg SQ q24h based on current renal function.  Spoke with Dr. Clint GuyHower, based on therapeutic INR of 2.12 and pt receiving enoxaparin 40 mg SQ this AM, MD stated pt sufficiently anticoagulated for today. Ok to start enoxaparin 55 mg SQ q24h starting tomorrow at 10 am.   Will need SCr and CBC every 3 days.  Pharmacy will continue to follow.    Marty HeckWang, Gianni Fuchs L 09/04/2015,12:25 PM

## 2015-09-04 NOTE — Progress Notes (Signed)
#  16 F foley inserted per MD orders. Pt tolerated well.

## 2015-09-05 LAB — CBC
HEMATOCRIT: 29.6 % — AB (ref 35.0–47.0)
HEMOGLOBIN: 9.9 g/dL — AB (ref 12.0–16.0)
MCH: 30.3 pg (ref 26.0–34.0)
MCHC: 33.5 g/dL (ref 32.0–36.0)
MCV: 90.5 fL (ref 80.0–100.0)
Platelets: 118 10*3/uL — ABNORMAL LOW (ref 150–440)
RBC: 3.27 MIL/uL — ABNORMAL LOW (ref 3.80–5.20)
RDW: 14.3 % (ref 11.5–14.5)
WBC: 4.3 10*3/uL (ref 3.6–11.0)

## 2015-09-05 LAB — ECHOCARDIOGRAM COMPLETE
EWDT: 268 ms
FS: 42 % (ref 28–44)
Height: 67 in
IV/PV OW: 1.36
LA diam end sys: 52 mm
LA diam index: 3.23 cm/m2
LA vol A4C: 92.6 ml
LA vol index: 67.1 mL/m2
LASIZE: 52 mm
LAVOL: 108 mL
MV Annulus VTI: 40.1 cm
MV Dec: 268
MV M vel: 61.4
MV Peak grad: 10 mmHg
MVG: 2 mmHg
MVPKAVEL: 89.3 m/s
MVPKEVEL: 156 m/s
PW: 8.55 mm — AB (ref 0.6–1.1)
Weight: 1880 oz

## 2015-09-05 LAB — BASIC METABOLIC PANEL
ANION GAP: 9 (ref 5–15)
BUN: 35 mg/dL — AB (ref 6–20)
CALCIUM: 8.2 mg/dL — AB (ref 8.9–10.3)
CO2: 28 mmol/L (ref 22–32)
Chloride: 101 mmol/L (ref 101–111)
Creatinine, Ser: 1.06 mg/dL — ABNORMAL HIGH (ref 0.44–1.00)
GFR calc Af Amer: 51 mL/min — ABNORMAL LOW (ref >60)
GFR, EST NON AFRICAN AMERICAN: 44 mL/min — AB (ref >60)
GLUCOSE: 101 mg/dL — AB (ref 65–99)
POTASSIUM: 3.5 mmol/L (ref 3.5–5.1)
Sodium: 138 mmol/L (ref 135–145)

## 2015-09-05 LAB — PROTIME-INR
INR: 2.21
Prothrombin Time: 24.3 seconds — ABNORMAL HIGH (ref 11.4–15.0)

## 2015-09-05 MED ORDER — IPRATROPIUM-ALBUTEROL 0.5-2.5 (3) MG/3ML IN SOLN: 3.0000 mL | RESPIRATORY_TRACT | Status: DC | PRN

## 2015-09-05 MED ORDER — WARFARIN SODIUM 5 MG PO TABS
5.0000 mg | ORAL_TABLET | Freq: Every day | ORAL | Status: DC
  Administered 2015-09-05: 5 mg via ORAL
  Filled 2015-09-05: qty 1

## 2015-09-05 MED ORDER — WARFARIN - PHARMACIST DOSING INPATIENT: Freq: Every day | Status: DC

## 2015-09-05 MED ORDER — FUROSEMIDE 10 MG/ML IJ SOLN: 40.0000 mg | Freq: Every day | INTRAMUSCULAR | Status: DC

## 2015-09-05 NOTE — Progress Notes (Signed)
Pt scheduled to get 40mg  IV lasix this AM, pt's vital signs were  Filed Vitals:   09/04/15 2133 09/05/15 0513  BP: 110/38 116/36  Pulse: 51 60  Temp:  98.5 F (36.9 C)  Resp:  16  MD paged to see whether to hold this medication or to give them, Dr. Tobi BastosPyreddy states to hold lasix dose for 600am. .will continue to monitor. Shirley FriarAlexis Miller, RN

## 2015-09-05 NOTE — Progress Notes (Signed)
MD notified. PTs BP is low102/35. MD order to hold all BP medicines this a.m.

## 2015-09-05 NOTE — Care Management Important Message (Signed)
Important Message  Patient Details  Name: Brandi PoagFrances Mani MRN: [id number] Date of Birth: 11/25/1921   Medicare Important Message Given:  Yes    Eber HongGreene, Kyasia Steuck R, RN 09/05/2015, 9:01 AM

## 2015-09-05 NOTE — Clinical Documentation Improvement (Signed)
Internal Medicine at [facility] and/or Consultants  Please document query responses in the progress notes and discharge summary, not on the CDI BPA in [facility]. Please do not deactivate queries without responding to them first. Thank you!  Please document if a condition below provides greater specificity regarding the patient's respiratory status on admission:  - Acute Hypoxic Respiratory Failure, improved  - Other condition  - Unable to clinically determine  Clinical Information: O2 sat on room air per EMS was 82% requiring 3 liters of nasal O2 to keep sat around 96%.   Respiratory Rate was in the mid 30's per ED nurse documentation.  Please exercise your independent, professional judgment when responding. A specific answer is not anticipated or expected.   Thank You, Jerral Ralphathy R Amarion Portell  RN BSN CCDS [phone] Health Information Management [place]

## 2015-09-05 NOTE — Progress Notes (Addendum)
ANTICOAGULATION CONSULT NOTE - Initial Consult  Pharmacy Consult for warfarin Indication: atrial fibrillation  No Known Allergies  Patient Measurements: Height: 5\' 7"  (170.2 cm) Weight: 113 lb 1.6 oz (51.302 kg) IBW/kg (Calculated) : 61.6   Vital Signs: Temp: 98.4 F (36.9 C) (06/09 1218) Temp Source: Oral (06/09 1218) BP: 99/35 mmHg (06/09 1218) Pulse Rate: 70 (06/09 1218)  Labs:  Recent Labs  09/04/15 0538 09/04/15 1003 09/04/15 1352 09/04/15 1703 09/05/15 0431 09/05/15 1354  HGB 10.9*  --   --   --  9.9*  --   HCT 32.6*  --   --   --  29.6*  --   PLT 149*  --   --   --  118*  --   LABPROT 23.6*  --   --   --   --  24.3*  INR 2.12  --   --   --   --  2.21  CREATININE 1.01*  --   --   --  1.06*  --   TROPONINI 0.28* 1.38* 2.32* 3.29*  --   --     Estimated Creatinine Clearance: 27.4 mL/min (by C-G formula based on Cr of 1.06).   Medical History: History reviewed. No pertinent past medical history.  Medications:  Scheduled:  . aspirin EC  81 mg Oral Daily  . diltiazem  240 mg Oral Daily  . feeding supplement (ENSURE ENLIVE)  237 mL Oral TID BM  . [START ON 09/06/2015] furosemide  40 mg Intravenous Daily  . hydrALAZINE  100 mg Oral TID  . isosorbide mononitrate  60 mg Oral Daily  . lisinopril  40 mg Oral Daily  . metoprolol  100 mg Oral BID  . sodium chloride flush  3 mL Intravenous Q12H  . traZODone  100 mg Oral QHS  . warfarin  5 mg Oral q1800  . Warfarin - Pharmacist Dosing Inpatient   Does not apply q1800   Infusions:    Assessment: Pharmacy consulted to restart warfarin in a 82 yo female with history of atrial fibrillation.  PTA dosing of warfarin 5 mg on Monday, Tuesday, Wednesday, Thursday and 6 mg all other days of the week.  Patient admitted with therapeutic INR of 2.12.     Patient currently ordered Lovenox 55 mg subq q24h.  6/9- INR: 2.21  Goal of Therapy:  INR 2-3 Monitor platelets by anticoagulation protocol: Yes   Plan:  Will  restart warfarin 5 mg po daily as INR has remained therapeutic with no doses administered during this admission. Will discontinue Lovenox 55 mg subq q24h. Will recheck INR in AM.  Azyria Osmon G 09/05/2015,2:51 PM

## 2015-09-05 NOTE — Care Management (Signed)
[facility].  Patient is extremely hard of hearing.  Spoke with her daughter in law- [name].  Patient does very well in her own environment.  uses a walker. Her apartment has call bells in case of emergency.  [name] manages patient's meds.  Physicians do not anticipate need for home 02 after diuresis is completed.   Discussed during progression the need to mobilize patient.  [name] does not feel that patient needs home health nursing as she herself has managed patient's care needs for years. Patient has not had history of [facility]

## 2015-09-05 NOTE — Progress Notes (Signed)
Initial Heart Failure Clinic appointment scheduled for September 24, 2015 at 11:30am. Thank you.

## 2015-09-05 NOTE — Progress Notes (Signed)
Pts BP is low. MD notified. Orders to hold IV lasix and give Po hydrazaline. I will continue to assess.

## 2015-09-05 NOTE — Progress Notes (Addendum)
ANTICOAGULATION CONSULT NOTE - Follow Up Consult  Pharmacy Consult for Enoxaparin Indication: chest pain/ACS  No Known Allergies  Patient Measurements: Height: 5\' 7"  (170.2 cm) Weight: 113 lb 1.6 oz (51.302 kg) IBW/kg (Calculated) : 61.6   Vital Signs: Temp: 98.5 F (36.9 C) (06/09 0513) Temp Source: Oral (06/09 0513) BP: 116/36 mmHg (06/09 0513) Pulse Rate: 60 (06/09 0513)  Labs:  Recent Labs  09/04/15 0538 09/04/15 1003 09/04/15 1352 09/04/15 1703 09/05/15 0431  HGB 10.9*  --   --   --  9.9*  HCT 32.6*  --   --   --  29.6*  PLT 149*  --   --   --  118*  LABPROT 23.6*  --   --   --   --   INR 2.12  --   --   --   --   CREATININE 1.01*  --   --   --  1.06*  TROPONINI 0.28* 1.38* 2.32* 3.29*  --     Estimated Creatinine Clearance: 27.4 mL/min (by C-G formula based on Cr of 1.06).  Assessment: 82 yo female on warfarin for AFib prior to admission now with troponins trending up. Orders to start on enoxaparin.   INR on 6/8 of 2.12  Est CrCl~27 mL/min  Goal of Therapy:  Monitor platelets by anticoagulation protocol: Yes   Plan:  Continue enoxaparin 55 mg SQ q24h based on current renal function.  Will need SCr and CBC every 3 days.  Pharmacy will continue to follow.    Donnielle Addison G 09/05/2015,11:33 AM

## 2015-09-05 NOTE — Progress Notes (Signed)
[facility] - [place] at [facility]   PATIENT NAME: Brandi Aguirre    MRN#:  [id number]  DATE OF BIRTH:  April 01, 1921  SUBJECTIVE:  Hospital Day: 1 day Brandi Aguirre is a 82 y.o. female presenting with Shortness of Breath .   Overnight events: no overnight events Interval Events: extremely hard of hearing, but no complaints  REVIEW OF SYSTEMS:  CONSTITUTIONAL: No fever, fatigue or weakness.  EYES: No blurred or double vision.  EARS, NOSE, AND THROAT: No tinnitus or ear pain.  RESPIRATORY: No cough, shortness of breath, wheezing or hemoptysis.  CARDIOVASCULAR: No chest pain, orthopnea, edema.  GASTROINTESTINAL: No nausea, vomiting, diarrhea or abdominal pain.  GENITOURINARY: No dysuria, hematuria.  ENDOCRINE: No polyuria, nocturia,  HEMATOLOGY: No anemia, easy bruising or bleeding SKIN: No rash or lesion. MUSCULOSKELETAL: No joint pain or arthritis.   NEUROLOGIC: No tingling, numbness, weakness.  PSYCHIATRY: No anxiety or depression.   DRUG ALLERGIES:  No Known Allergies  VITALS:  Blood pressure 99/35, pulse 70, temperature 98.4 F (36.9 C), temperature source Oral, resp. rate 18, height  (1.702 m), weight 113 lb 1.6 oz (51.302 kg), SpO2 93 %.  PHYSICAL EXAMINATION:  VITAL SIGNS: Filed Vitals:   09/05/15 1140 09/05/15 1218  BP: 102/35 99/35  Pulse: 115 70  Temp: 98.8 F (37.1 C) 98.4 F (36.9 C)  Resp:  18   GENERAL:82 y.o.female currently in no acute distress. Frail appearing HEAD: Normocephalic, atraumatic.  EYES: Pupils equal, round, reactive to light. Extraocular muscles intact. No scleral icterus.  MOUTH: Moist mucosal membrane. Dentition intact. No abscess noted.  EAR, NOSE, THROAT: Clear without exudates. No external lesions.  NECK: Supple. No thyromegaly. No nodules. No JVD.  PULMONARY: Clear to ascultation, without wheeze rails or rhonci. No use of accessory muscles, Good respiratory effort. good air entry bilaterally CHEST:  Nontender to palpation.  CARDIOVASCULAR: S1 and S2. Regular rate and rhythm. No murmurs, rubs, or gallops. No edema. Pedal pulses 2+ bilaterally.  GASTROINTESTINAL: Soft, nontender, nondistended. No masses. Positive bowel sounds. No hepatosplenomegaly.  MUSCULOSKELETAL: No swelling, clubbing, or edema. Range of motion full in all extremities.  NEUROLOGIC: Cranial nerves II through XII are intact. No gross focal neurological deficits. Sensation intact. Reflexes intact.  SKIN: No ulceration, lesions, rashes, or cyanosis. Skin warm and dry. Turgor intact.  PSYCHIATRIC: Mood, affect within normal limits. The patient is awake, alert and oriented x 3. Insight, judgment intact.      LABORATORY PANEL:   CBC  Recent Labs Lab 09/05/15 0431  WBC 4.3  HGB 9.9*  HCT 29.6*  PLT 118*   ------------------------------------------------------------------------------------------------------------------  Chemistries   Recent Labs Lab 09/05/15 0431  NA 138  Aguirre 3.5  CL 101  CO2 28  GLUCOSE 101*  BUN 35*  CREATININE 1.06*  CALCIUM 8.2*   ------------------------------------------------------------------------------------------------------------------  Cardiac Enzymes  Recent Labs Lab 09/04/15 1703  TROPONINI 3.29*   ------------------------------------------------------------------------------------------------------------------  RADIOLOGY:  Dg Chest Port 1 View  09/04/2015  CLINICAL DATA:  Dyspnea and hypoxia EXAM: PORTABLE CHEST 1 VIEW COMPARISON:  06/18/2014 FINDINGS: Chronic cardiopericardial enlargement. Stable aortic and hilar contours. Dual-chamber pacer from the right in stable position. New diffuse interstitial opacity with Kerley lines. IMPRESSION: CHF pattern. Electronically Signed   By: Marnee Spring M.D.   On: 09/04/2015 06:12    EKG:   Orders placed or performed during the hospital encounter of 09/04/15  . ED EKG  . ED EKG  . EKG 12-Lead  . EKG 12-Lead  ASSESSMENT AND PLAN:   Brandi Aguirre is a 82 y.o. female presenting with Shortness of Breath . Admitted 09/04/2015 : Day #: 1 day 1. Acute respiratory failure with hypoxia: o2 as required, continue to wean, duoneb, decrease lasix 2. Elevated troponin: cardiology input appreciated, transition lovenox back to warfarin  3. Essential htn: lisinopril,cardizem, hydralazine, imdur, lopressor 4. Atrial fib: cardizem, warfarin  All the records are reviewed and case discussed with Care Management/Social Workerr. Management plans discussed with the patient, family and they are in agreement.  CODE STATUS: full TOTAL TIME TAKING CARE OF THIS PATIENT: 28 minutes.   POSSIBLE D/C IN 1-2DAYS, DEPENDING ON CLINICAL CONDITION.   Hower,  Brandi Aguirre M.D on 09/05/2015 at 2:43 PM  Between 7am to 6pm - Pager - [phone]  After 6pm: House Pager: - [phone]  [facility]  Office  [phone]  CC: Primary care physician; [facility]

## 2015-09-06 LAB — PROTIME-INR
INR: 1.83
Prothrombin Time: 21.1 seconds — ABNORMAL HIGH (ref 11.4–15.0)

## 2015-09-06 MED ORDER — METOPROLOL TARTRATE 25 MG PO TABS: 25.0000 mg | ORAL_TABLET | Freq: Two times a day (BID) | ORAL | Status: DC

## 2015-09-06 MED ORDER — WARFARIN SODIUM 5 MG PO TABS
6.0000 mg | ORAL_TABLET | ORAL | Status: DC
  Administered 2015-09-06: 6 mg via ORAL
  Filled 2015-09-06: qty 1

## 2015-09-06 MED ORDER — FUROSEMIDE 20 MG PO TABS
20.0000 mg | ORAL_TABLET | Freq: Every day | ORAL | Status: DC
  Administered 2015-09-06 – 2015-09-07 (×2): 20 mg via ORAL
  Filled 2015-09-06 (×2): qty 1

## 2015-09-06 MED ORDER — WARFARIN SODIUM 5 MG PO TABS: 5.0000 mg | ORAL_TABLET | ORAL | Status: DC

## 2015-09-06 MED ORDER — METOPROLOL TARTRATE 50 MG PO TABS
50.0000 mg | ORAL_TABLET | Freq: Two times a day (BID) | ORAL | Status: DC
  Administered 2015-09-06 – 2015-09-07 (×2): 50 mg via ORAL
  Filled 2015-09-06 (×2): qty 1

## 2015-09-06 MED ORDER — LISINOPRIL 5 MG PO TABS
2.5000 mg | ORAL_TABLET | Freq: Every day | ORAL | Status: DC
  Administered 2015-09-07: 2.5 mg via ORAL
  Filled 2015-09-06: qty 1

## 2015-09-06 MED ORDER — HYDRALAZINE HCL 10 MG PO TABS
10.0000 mg | ORAL_TABLET | Freq: Three times a day (TID) | ORAL | Status: DC
  Administered 2015-09-06 – 2015-09-07 (×3): 10 mg via ORAL
  Filled 2015-09-06 (×4): qty 1

## 2015-09-06 NOTE — Progress Notes (Signed)
ANTICOAGULATION CONSULT NOTE - Folow Up Consult  Pharmacy Consult for warfarin Indication: atrial fibrillation  No Known Allergies  Patient Measurements: Height: 5\' 7"  (170.2 cm) Weight: 115 lb 6.4 oz (52.345 kg) IBW/kg (Calculated) : 61.6   Vital Signs: Temp: 98.1 F (36.7 C) (06/10 0510) Temp Source: Oral (06/10 0510) BP: 125/52 mmHg (06/10 0510) Pulse Rate: 91 (06/10 0510)  Labs:  Recent Labs  09/04/15 0538 09/04/15 1003 09/04/15 1352 09/04/15 1703 09/05/15 0431 09/05/15 1354 09/06/15 0529  HGB 10.9*  --   --   --  9.9*  --   --   HCT 32.6*  --   --   --  29.6*  --   --   PLT 149*  --   --   --  118*  --   --   LABPROT 23.6*  --   --   --   --  24.3* 21.1*  INR 2.12  --   --   --   --  2.21 1.83  CREATININE 1.01*  --   --   --  1.06*  --   --   TROPONINI 0.28* 1.38* 2.32* 3.29*  --   --   --     Estimated Creatinine Clearance: 28 mL/min (by C-G formula based on Cr of 1.06).   Medical History: History reviewed. No pertinent past medical history.  Medications:  Scheduled:  . aspirin EC  81 mg Oral Daily  . diltiazem  240 mg Oral Daily  . feeding supplement (ENSURE ENLIVE)  237 mL Oral TID BM  . furosemide  40 mg Intravenous Daily  . hydrALAZINE  100 mg Oral TID  . isosorbide mononitrate  60 mg Oral Daily  . lisinopril  40 mg Oral Daily  . metoprolol  100 mg Oral BID  . sodium chloride flush  3 mL Intravenous Q12H  . traZODone  100 mg Oral QHS  . [START ON 09/08/2015] warfarin  5 mg Oral Once per day on Mon Tue Wed Thu  . warfarin  6 mg Oral Once per day on Sun Fri Sat  . Warfarin - Pharmacist Dosing Inpatient   Does not apply q1800    Assessment: Pharmacy consulted to restart warfarin in a 82 yo female with history of atrial fibrillation.  PTA dosing of warfarin 5 mg on Monday, Tuesday, Wednesday, Thursday and 6 mg all other days of the week.  Patient admitted with therapeutic INR of 2.12.     6/8  INR  2.12  Warfarin 5mg  and Lovenox 40mg  given.  6/9   INR  2.21  Warfarin 5mg  and Lovenox 55mg  given.  6/10 INR 1.83   Goal of Therapy:  INR 2-3 Monitor platelets by anticoagulation protocol: Yes   Plan:  Will restart home regimen of warfarin 6 mg on Fri/Sat/Sun and warfarin 5mg  on MTWTh.  Lovenox has been discontinued.   Will recheck INR in AM.  Stormy CardKatsoudas,Blasa Raisch K, RPh Clinical Pharmacist 09/06/2015,9:38 AM

## 2015-09-06 NOTE — Progress Notes (Signed)
Patient ID: Brandi Aguirre, female   DOB: 08-02-1921, 82 y.o.   MRN: [id number] [facility] PROGRESS NOTE  Brandi PoagFrances Mcdermid WUJ:[id number] DOB: 08-02-1921 DOA: 09/04/2015 PCP: [facility]  HPI/Subjective: Patient feels okay. Breathing better. Offers no complaints.  Objective: Filed Vitals:   09/06/15 0510 09/06/15 1130  BP: 125/52   Pulse: 91 107  Temp: 98.1 F (36.7 C)   Resp: 18     Intake/Output Summary (Last 24 hours) at 09/06/15 1531 Last data filed at 09/06/15 1423  Gross per 24 hour  Intake    840 ml  Output   1050 ml  Net   -210 ml   Filed Weights   09/04/15 0904 09/05/15 0513 09/06/15 0500  Weight: 53.298 kg (117 lb 8 oz) 51.302 kg (113 lb 1.6 oz) 52.345 kg (115 lb 6.4 oz)    ROS: Review of Systems  Constitutional: Negative for fever and chills.  Eyes: Negative for blurred vision.  Respiratory: Negative for cough and shortness of breath.   Cardiovascular: Negative for chest pain.  Gastrointestinal: Positive for constipation. Negative for nausea, vomiting, abdominal pain and diarrhea.  Genitourinary: Negative for dysuria.  Musculoskeletal: Negative for joint pain.  Neurological: Negative for dizziness and headaches.   Exam: Physical Exam  Constitutional: She is oriented to person, place, and time.  HENT:  Nose: No mucosal edema.  Mouth/Throat: No oropharyngeal exudate or posterior oropharyngeal edema.  Eyes: Conjunctivae, EOM and lids are normal. Pupils are equal, round, and reactive to light.  Neck: No JVD present. Carotid bruit is not present. No edema present. No thyroid mass and no thyromegaly present.  Cardiovascular: S1 normal and S2 normal.  Exam reveals no gallop.   No murmur heard. Pulses:      Dorsalis pedis pulses are 2+ on the right side, and 2+ on the left side.  Respiratory: No respiratory distress. She has no wheezes. She has no rhonchi. She has no rales.  GI: Soft. Bowel sounds are normal. There is no tenderness.   Musculoskeletal:       Right ankle: She exhibits swelling.       Left ankle: She exhibits swelling.  Lymphadenopathy:    She has no cervical adenopathy.  Neurological: She is alert and oriented to person, place, and time.  Skin: Skin is warm. No rash noted. Nails show no clubbing.  Psychiatric: She has a normal mood and affect.      Data Reviewed: Basic Metabolic Panel:  Recent Labs Lab 09/04/15 0538 09/05/15 0431  NA 135 138  K 4.4 3.5  CL 101 101  CO2 26 28  GLUCOSE 203* 101*  BUN 31* 35*  CREATININE 1.01* 1.06*  CALCIUM 8.6* 8.2*   CBC:  Recent Labs Lab 09/04/15 0538 09/05/15 0431  WBC 9.8 4.3  HGB 10.9* 9.9*  HCT 32.6* 29.6*  MCV 90.9 90.5  PLT 149* 118*   Cardiac Enzymes:  Recent Labs Lab 09/04/15 0538 09/04/15 1003 09/04/15 1352 09/04/15 1703  TROPONINI 0.28* 1.38* 2.32* 3.29*     Scheduled Meds: . aspirin EC  81 mg Oral Daily  . diltiazem  240 mg Oral Daily  . feeding supplement (ENSURE ENLIVE)  237 mL Oral TID BM  . furosemide  20 mg Oral Daily  . hydrALAZINE  10 mg Oral TID  . isosorbide mononitrate  60 mg Oral Daily  . [START ON 09/07/2015] lisinopril  2.5 mg Oral Daily  . metoprolol  50 mg Oral BID  . sodium chloride flush  3  mL Intravenous Q12H  . traZODone  100 mg Oral QHS  . [START ON 09/08/2015] warfarin  5 mg Oral Once per day on Mon Tue Wed Thu  . warfarin  6 mg Oral Once per day on Sun Fri Sat    Assessment/Plan:  1. Acute respiratory failure with hypoxia. Patient has dipped down to 89% on room air. Hopefully will not need oxygen at home. 2. Acute combined systolic and diastolic congestive heart failure. Switch Lasix over to oral. Since blood pressure on the lower side I need to cut back on dosages of medications. 3. Atrial fibrillation. Need to continue both diltiazem and metoprolol. I did try to drop down the metoprolol dose but I may be unsuccessful in doing this with the heart rate. Continue Coumadin for  anticoagulation 4. Elevated troponin. Likely demand ischemia from heart failure. Cardiology wants to do medical management  Code Status:     Code Status Orders        Start     Ordered   09/04/15 0915  Full code   Continuous     09/04/15 0914    Code Status History    Date Active Date Inactive Code Status Order ID Comments User Context   This patient has a current code status but no historical code status.    Advance Directive Documentation        Most Recent Value   Type of Advance Directive  Healthcare Power of [place], Living will Lavonna Monarch, Kathe Mariner are [facility] ]   Pre-existing out of facility DNR order (yellow form or pink MOST form)     "MOST" Form in Place?       Family Communication: Family at bedside Disposition Plan: Potentially home in the next day or so  Consultants:  Cardiology  Time spent: 28 minutes  Alford Highland  [employer]

## 2015-09-06 NOTE — Progress Notes (Signed)
Pt up to sink to brush her teeth, assist x 1 while she ambulated around the room to sit in her chair.  O2 sats 91-94% on room air at rest, 90% while standing at sink. Briefly dropped to 87% right before she sat down in her chair, increased quickly back to 95% after she sat down. Patient is very unsteady on her feet, at this time she needs a walker and assist x 1 to be safe.

## 2015-09-07 LAB — BASIC METABOLIC PANEL
ANION GAP: 5 (ref 5–15)
BUN: 35 mg/dL — ABNORMAL HIGH (ref 6–20)
CALCIUM: 8.1 mg/dL — AB (ref 8.9–10.3)
CO2: 29 mmol/L (ref 22–32)
Chloride: 102 mmol/L (ref 101–111)
Creatinine, Ser: 0.84 mg/dL (ref 0.44–1.00)
GFR calc Af Amer: >60 mL/min (ref >60)
GFR, EST NON AFRICAN AMERICAN: 59 mL/min — AB (ref >60)
Glucose, Bld: 99 mg/dL (ref 65–99)
POTASSIUM: 3.9 mmol/L (ref 3.5–5.1)
Sodium: 136 mmol/L (ref 135–145)

## 2015-09-07 LAB — PROTIME-INR
INR: 1.69
PROTHROMBIN TIME: 19.9 s — AB (ref 11.4–15.0)

## 2015-09-07 MED ORDER — WARFARIN SODIUM 6 MG PO TABS: 6.0000 mg | ORAL_TABLET | Freq: Every day | ORAL | Status: DC

## 2015-09-07 MED ORDER — FUROSEMIDE 20 MG PO TABS: 20.0000 mg | ORAL_TABLET | Freq: Every day | ORAL | Status: AC

## 2015-09-07 MED ORDER — HYDRALAZINE HCL 10 MG PO TABS: 10.0000 mg | ORAL_TABLET | Freq: Three times a day (TID) | ORAL | Status: DC

## 2015-09-07 MED ORDER — WARFARIN SODIUM 5 MG PO TABS: 7.0000 mg | ORAL_TABLET | Freq: Every day | ORAL | Status: DC

## 2015-09-07 MED ORDER — METOPROLOL TARTRATE 50 MG PO TABS: 50.0000 mg | ORAL_TABLET | Freq: Two times a day (BID) | ORAL | Status: DC

## 2015-09-07 MED ORDER — LISINOPRIL 2.5 MG PO TABS: 2.5000 mg | ORAL_TABLET | Freq: Every day | ORAL | Status: DC

## 2015-09-07 MED ORDER — WARFARIN SODIUM 5 MG PO TABS
7.0000 mg | ORAL_TABLET | Freq: Every day | ORAL | Status: DC
  Filled 2015-09-07: qty 1

## 2015-09-07 MED ORDER — WARFARIN - PHARMACIST DOSING INPATIENT
Freq: Every day | Status: DC
Start: 2015-09-07 — End: 2015-09-07

## 2015-09-07 NOTE — Progress Notes (Signed)
ANTICOAGULATION CONSULT NOTE - Folow Up Consult  Pharmacy Consult for warfarin Indication: atrial fibrillation  No Known Allergies  Patient Measurements: Height: 5\' 7"  (170.2 cm) Weight: 111 lb 12.8 oz (50.712 kg) IBW/kg (Calculated) : 61.6   Vital Signs: Temp: 97.7 F (36.5 C) (06/11 0534) BP: 116/46 mmHg (06/11 0534) Pulse Rate: 71 (06/11 0534)  Labs:  Recent Labs  09/04/15 1003 09/04/15 1352 09/04/15 1703 09/05/15 0431 09/05/15 1354 09/06/15 0529 09/07/15 0546  HGB  --   --   --  9.9*  --   --   --   HCT  --   --   --  29.6*  --   --   --   PLT  --   --   --  118*  --   --   --   LABPROT  --   --   --   --  24.3* 21.1* 19.9*  INR  --   --   --   --  2.21 1.83 1.69  CREATININE  --   --   --  1.06*  --   --   --   TROPONINI 1.38* 2.32* 3.29*  --   --   --   --     Estimated Creatinine Clearance: 27.1 mL/min (by C-G formula based on Cr of 1.06).   Medical History: History reviewed. No pertinent past medical history.  Medications:  Scheduled:  . aspirin EC  81 mg Oral Daily  . diltiazem  240 mg Oral Daily  . feeding supplement (ENSURE ENLIVE)  237 mL Oral TID BM  . furosemide  20 mg Oral Daily  . hydrALAZINE  10 mg Oral TID  . isosorbide mononitrate  60 mg Oral Daily  . lisinopril  2.5 mg Oral Daily  . metoprolol  50 mg Oral BID  . sodium chloride flush  3 mL Intravenous Q12H  . traZODone  100 mg Oral QHS  . [START ON 09/08/2015] warfarin  5 mg Oral Once per day on Mon Tue Wed Thu  . warfarin  6 mg Oral Once per day on Sun Fri Sat    Assessment: Pharmacy consulted to restart warfarin in a 82 yo female with history of atrial fibrillation.  PTA dosing of warfarin 5 mg on Monday, Tuesday, Wednesday, Thursday and 6 mg all other days of the week.  Patient admitted with therapeutic INR of 2.12.     6/8  INR  2.12  Warfarin 5mg  and Lovenox 40mg  given.  6/9  INR  2.21  Warfarin 5mg  and Lovenox 55mg  given.  6/10 INR 1.83  Warfarin 6mg  given 6/11 INR  1.69  Goal of Therapy:  INR 2-3 Monitor platelets by anticoagulation protocol: Yes   Plan:  INR is steadily decreasing.  Will increase dose to 7mg  daily.  Will recheck INR in AM.  Stormy CardKatsoudas,Texanna Hilburn K, RPh Clinical Pharmacist 09/07/2015,8:26 AM

## 2015-09-07 NOTE — Discharge Instructions (Signed)
Heart Failure Clinic appointment on September 24, 2015 at 11:30am with Brandi Kindredina Hackney, FNP. Please call [phone] to reschedule.     On 09/07/2015 please take 7.5 mg of coumadin (1 1/2 of your 5mg  tablets) then 6 mg daily afterwards.  Check inr this week Stop hydrochlorothiazide   Heart Failure Heart failure means your heart has trouble pumping blood. This makes it hard for your body to work well. Heart failure is usually a long-term (chronic) condition. You must take good care of yourself and follow your doctor's treatment plan. HOME CARE  Take your heart medicine as told by your doctor.  Do not stop taking medicine unless your doctor tells you to.  Do not skip any dose of medicine.  Refill your medicines before they run out.  Take other medicines only as told by your doctor or pharmacist.  Stay active if told by your doctor. The elderly and people with severe heart failure should talk with a doctor about physical activity.  Eat heart-healthy foods. Choose foods that are without trans fat and are low in saturated fat, cholesterol, and salt (sodium). This includes fresh or frozen fruits and vegetables, fish, lean meats, fat-free or low-fat dairy foods, whole grains, and high-fiber foods. Lentils and dried peas and beans (legumes) are also good choices.  Limit salt if told by your doctor.  Cook in a healthy way. Roast, grill, broil, bake, poach, steam, or stir-fry foods.  Limit fluids as told by your doctor.  Weigh yourself every morning. Do this after you pee (urinate) and before you eat breakfast. Write down your weight to give to your doctor.  Take your blood pressure and write it down if your doctor tells you to.  Ask your doctor how to check your pulse. Check your pulse as told.  Lose weight if told by your doctor.  Stop smoking or chewing tobacco. Do not use gum or patches that help you quit without your doctor's approval.  Schedule and go to doctor visits as  told.  Nonpregnant women should have no more than 1 drink a day. Men should have no more than 2 drinks a day. Talk to your doctor about drinking alcohol.  Stop illegal drug use.  Stay current with shots (immunizations).  Manage your health conditions as told by your doctor.  Learn to manage your stress.  Rest when you are tired.  If it is really hot outside:  Avoid intense activities.  Use air conditioning or fans, or get in a cooler place.  Avoid caffeine and alcohol.  Wear loose-fitting, lightweight, and light-colored clothing.  If it is really cold outside:  Avoid intense activities.  Layer your clothing.  Wear mittens or gloves, a hat, and a scarf when going outside.  Avoid alcohol.  Learn about heart failure and get support as needed.  Get help to maintain or improve your quality of life and your ability to care for yourself as needed. GET HELP IF:   You gain weight quickly.  You are more short of breath than usual.  You cannot do your normal activities.  You tire easily.  You cough more than normal, especially with activity.  You have any or more puffiness (swelling) in areas such as your hands, feet, ankles, or belly (abdomen).  You cannot sleep because it is hard to breathe.  You feel like your heart is beating fast (palpitations).  You get dizzy or light-headed when you stand up. GET HELP RIGHT AWAY IF:   You have trouble  breathing.  There is a change in mental status, such as becoming less alert or not being able to focus.  You have chest pain or discomfort.  You faint. MAKE SURE YOU:   Understand these instructions.  Will watch your condition.  Will get help right away if you are not doing well or get worse.   This information is not intended to replace advice given to you by your health care provider. Make sure you discuss any questions you have with your health care provider.   Document Released: 12/23/2007 Document Revised:  04/05/2014 Document Reviewed: 05/01/2012 Elsevier Interactive Patient Education [employer]

## 2015-09-07 NOTE — Care Management Note (Signed)
Case Management Note  Patient Details  Name: Brandi Aguirre MRN: [id number] Date of Birth: 1922/02/14  Subjective/Objective:   A referral was faxed to [facility] for HH-PT. Brandi Brandi Aguirre's daughter  Brandi Aguirre chose [facility] from a list of home health providers. A note on Brandi Winners demographic sheet faxed to [facility] requested that [facility] call daughter Brandi Aguirre at [phone] to schedule appointments per Brandi Aguirre is hard of hearing.                 Action/Plan:   Expected Discharge Date:                  Expected Discharge Plan:     In-House Referral:     Discharge planning Services     Post Acute Care Choice:    Choice offered to:     DME Arranged:    DME Agency:     HH Arranged:    HH Agency:     Status of Service:     Medicare Important Message Given:  Yes Date Medicare IM Given:    Medicare IM give by:    Date Additional Medicare IM Given:    Additional Medicare Important Message give by:     If discussed at Long Length of Stay Meetings, dates discussed:    Additional Comments:  Brandi Aguirre A, RN 09/07/2015, 10:22 AM

## 2015-09-07 NOTE — Discharge Summary (Signed)
[employer] - [place] at [facility]   PATIENT NAME: Brandi Aguirre    MR#:  [id number]  DATE OF BIRTH:  1921/10/15  DATE OF ADMISSION:  09/04/2015 ADMITTING PHYSICIAN: Wyatt Hasteavid K Hower, MD  DATE OF DISCHARGE: 09/07/2015 11:32 AM  PRIMARY CARE PHYSICIAN: [facility]    ADMISSION DIAGNOSIS:  Acute congestive heart failure, unspecified congestive heart failure type (HCC) [I50.9]  DISCHARGE DIAGNOSIS:  Principal Problem:   CHF (congestive heart failure) (HCC) Active Problems:   HTN (hypertension)   Atrial fibrillation (HCC)   Dyslipidemia   HOH (hard of hearing)   SECONDARY DIAGNOSIS:  History reviewed. No pertinent past medical history.  HOSPITAL COURSE:   1. Acute respiratory failure with hypoxia. Patient required oxygen during the hospital course and was able to be tapered off oxygen to go home. 2. Acute combined systolic and diastolic congestive heart failure. Patient was diuresed with IV Lasix and then switched over to oral Lasix upon discharge home. Continue metoprolol and low-dose lisinopril. 3. Atrial fibrillation. Continue diltiazem and metoprolol 50 mg twice a day. Benefits and risks of Coumadin explained to patient and family. This further discussion can happen with PMD. 4. Elevated troponin likely demand ischemia from heart failure cardiology wants to do medical management. 5. Relative hypotension and needed to cut back on dosages of metoprolol lisinopril and hydralazine. Blood pressure stabilized on the lower side. 6. Weakness home health physical therapy set up. Patient and family did not want to go to rehabilitation wanted to go back to their independent living. Risk of Coumadin and falls explained to patient and family. Further discussion with PMD 7. Subtherapeutic INR. INR slightly low upon discharge home. I increased it to 7.5 mg of Coumadin for 1 day and then 6 mg daily after that. Recommend checking INR weekly until more  stabilized.  DISCHARGE CONDITIONS:   Satisfactory  CONSULTS OBTAINED:  Treatment Team:  Dalia HeadingKenneth A Fath, MD  DRUG ALLERGIES:  No Known Allergies  DISCHARGE MEDICATIONS:   Discharge Medication List as of 09/07/2015 11:24 AM    START taking these medications   Details  furosemide (LASIX) 20 MG tablet Take 1 tablet (20 mg total) by mouth daily., Starting 09/07/2015, Until Discontinued, Print      CONTINUE these medications which have CHANGED   Details  hydrALAZINE (APRESOLINE) 10 MG tablet Take 1 tablet (10 mg total) by mouth 3 (three) times daily., Starting 09/07/2015, Until Discontinued, Print    lisinopril (PRINIVIL,ZESTRIL) 2.5 MG tablet Take 1 tablet (2.5 mg total) by mouth daily., Starting 09/07/2015, Until Discontinued, Print    metoprolol (LOPRESSOR) 50 MG tablet Take 1 tablet (50 mg total) by mouth 2 (two) times daily., Starting 09/07/2015, Until Discontinued, Print    warfarin (COUMADIN) 6 MG tablet Take 1 tablet (6 mg total) by mouth daily at 6 PM., Starting 09/08/2015, Until Discontinued, Print      CONTINUE these medications which have NOT CHANGED   Details  diltiazem (CARDIZEM) 120 MG tablet Take 2 tablets by mouth daily., Starting 08/04/2015, Until Discontinued, Historical Med    isosorbide mononitrate (IMDUR) 60 MG 24 hr tablet Take 60 mg by mouth daily. , Starting 08/05/2015, Until Discontinued, Historical Med    traZODone (DESYREL) 100 MG tablet Take 100 mg by mouth at bedtime. , Starting 08/04/2015, Until Discontinued, Historical Med      STOP taking these medications     hydrochlorothiazide (HYDRODIURIL) 25 MG tablet          DISCHARGE INSTRUCTIONS:  Follow-up with Dr. Lucia Estelle as outpatient  If you experience worsening of your admission symptoms, develop shortness of breath, life threatening emergency, suicidal or homicidal thoughts you must seek medical attention immediately by calling 911 or calling your MD immediately  if symptoms less severe.  You Must  read complete instructions/literature along with all the possible adverse reactions/side effects for all the Medicines you take and that have been prescribed to you. Take any new Medicines after you have completely understood and accept all the possible adverse reactions/side effects.   Please note  You were cared for by a hospitalist during your hospital stay. If you have any questions about your discharge medications or the care you received while you were in the hospital after you are discharged, you can call the unit and asked to speak with the hospitalist on call if the hospitalist that took care of you is not available. Once you are discharged, your primary care physician will handle any further medical issues. Please note that NO REFILLS for any discharge medications will be authorized once you are discharged, as it is imperative that you return to your primary care physician (or establish a relationship with a primary care physician if you do not have one) for your aftercare needs so that they can reassess your need for medications and monitor your lab values.    Today   CHIEF COMPLAINT:   Chief Complaint  Patient presents with  . Shortness of Breath    HISTORY OF PRESENT ILLNESS:  Brandi Aguirre  is a 82 y.o. female presented with shortness of breath and found to be in acute respiratory failure and CHF   VITAL SIGNS:  Blood pressure 112/47, pulse 79, temperature 97.9 F (36.6 C), temperature source Oral, resp. rate 15, height 5\' 7"  (1.702 m), weight 50.712 kg (111 lb 12.8 oz), SpO2 93 %.    PHYSICAL EXAMINATION:  GENERAL:  82 y.o.-year-old patient lying in the bed with no acute distress.  EYES: Pupils equal, round, reactive to light and accommodation. No scleral icterus. Extraocular muscles intact.  HEENT: Head atraumatic, normocephalic. Oropharynx and nasopharynx clear.  NECK:  Supple, no jugular venous distention. No thyroid enlargement, no tenderness.  LUNGS: Normal breath  sounds bilaterally, no wheezing, rales,rhonchi or crepitation. No use of accessory muscles of respiration.  CARDIOVASCULAR: S1, S2 normal. No murmurs, rubs, or gallops.  ABDOMEN: Soft, non-tender, non-distended. Bowel sounds present. No organomegaly or mass.  EXTREMITIES: No pedal edema, cyanosis, or clubbing.  NEUROLOGIC: Cranial nerves II through XII are intact. Muscle strength 5/5 in all extremities. Sensation intact. Gait not checked.  PSYCHIATRIC: The patient is alert and oriented x 3.  SKIN: No obvious rash, lesion, or ulcer.   DATA REVIEW:   CBC  Recent Labs Lab 09/05/15 0431  WBC 4.3  HGB 9.9*  HCT 29.6*  PLT 118*    Chemistries   Recent Labs Lab 09/07/15 0550  NA 136  K 3.9  CL 102  CO2 29  GLUCOSE 99  BUN 35*  CREATININE 0.84  CALCIUM 8.1*    Cardiac Enzymes  Recent Labs Lab 09/04/15 1703  TROPONINI 3.29*    Management plans discussed with the patient, family and they are in agreement.  CODE STATUS:     Code Status Orders        Start     Ordered   09/04/15 0915  Full code   Continuous     09/04/15 0914    Code Status History    Date  Active Date Inactive Code Status Order ID Comments User Context   This patient has a current code status but no historical code status.    Advance Directive Documentation        Most Recent Value   Type of Advance Directive  Healthcare Power of [place], Living will Lavonna Monarch, Kathe Mariner are [facility] ]   Pre-existing out of facility DNR order (yellow form or pink MOST form)     "MOST" Form in Place?        TOTAL TIME TAKING CARE OF THIS PATIENT: 35 minutes.    Alford Highland M.D on 09/07/2015 at 1:54 PM  Between 7am to 6pm - Pager - [phone]  After 6pm go to [email or web address] - password [email or web address]  [employer] Office  [phone]  CC: Primary care physician; [facility]. Dr Lucia Estelle

## 2015-09-07 NOTE — Progress Notes (Signed)
[facility]  SUBJECTIVE: [facility]icult [facility] but no complaints this am. Tele shows intermitant sr with probable afib. Rate farily well controlled. -approximately 2 lters since admission.    Filed Vitals:   09/06/15 1130 09/06/15 1944 09/07/15 0500 09/07/15 0534  BP:  121/46  116/46  Pulse: 107 84  71  Temp:  98.5 F (36.9 C)  97.7 F (36.5 C)  TempSrc:  Oral    Resp:  15  15  Height:      Weight:   50.712 kg (111 lb 12.8 oz)   SpO2: 87% 92%  92%    Intake/Output Summary (Last 24 hours) at 09/07/15 0818 Last data filed at 09/06/15 2235  Gross per 24 hour  Intake    600 ml  Output    750 ml  Net   -150 ml    LABS: Basic Metabolic Panel:  Recent Labs  16/12/9604/09/17 0431  NA 138  K 3.5  CL 101  CO2 28  GLUCOSE 101*  BUN 35*  CREATININE 1.06*  CALCIUM 8.2*   Liver Function Tests: No results for input(s): AST, ALT, ALKPHOS, BILITOT, PROT, ALBUMIN in the last 72 hours. No results for input(s): LIPASE, AMYLASE in the last 72 hours. CBC:  Recent Labs  09/05/15 0431  WBC 4.3  HGB 9.9*  HCT 29.6*  MCV 90.5  PLT 118*   Cardiac Enzymes:  Recent Labs  09/04/15 1003 09/04/15 1352 09/04/15 1703  TROPONINI 1.38* 2.32* 3.29*   BNP: Invalid input(s): POCBNP D-Dimer: No results for input(s): DDIMER in the last 72 hours. Hemoglobin A1C: No results for input(s): HGBA1C in the last 72 hours. Fasting Lipid Panel: No results for input(s): CHOL, HDL, LDLCALC, TRIG, CHOLHDL, LDLDIRECT in the last 72 hours. Thyroid Function Tests:  Recent Labs  09/04/15 1003  TSH 2.093   Anemia Panel: No results for input(s): VITAMINB12, FOLATE, FERRITIN, TIBC, IRON, RETICCTPCT in the last 72 hours.   Physical Exam: Blood pressure 116/46, pulse 71, temperature 97.7 F (36.5 C), temperature source Oral, resp. rate 15, height 5\' 7"  (1.702 m), weight 50.712 kg (111 lb 12.8 oz), SpO2 92 %.   Wt Readings from Last 1 Encounters:   09/07/15 50.712 kg (111 lb 12.8 oz)     General appearance: cooperative and HOH Neck: no adenopathy, no carotid bruit, no JVD, supple, symmetrical, trachea midline and thyroid not enlarged, symmetric, no tenderness/mass/nodules Cardio: regular rhythm with intermitant afib.  GI: soft, non-tender; bowel sounds normal; no masses,  no organomegaly Extremities: extremities normal, atraumatic, no cyanosis or edema Neurologic: Grossly normal  TELEMETRY: Reviewed telemetry pt in nsr with intermitant afib:  ASSESSMENT AND PLAN:  Principal Problem:   CHF (congestive heart failure) (HCC)-chf appears improved clinically. Would continue with po furosemide and ace i. Low sodium diet  Active Problems:   HTN (hypertension)-sbp appears to have stabilized on lisinopril 2.5, metorpolol 50 bid, imidur 60 daily, hydralazine 10 tid and cardizem. Would conitnue with this regimen as out patient   Atrial fibrillation (HCC)-rate controlled with metoprolol and diltiazem with intermitant sr. Anticoagulated with warfarin. INR goal of 2-3. Would continue with this    Dyslipidemia   HOH (hard of hearing)-chronic. Has a hearing aid.  Appears stable for discharge. Can see as outpatient next week.     Dalia HeadingFATH,Shamere Campas A., MD, [facility] 09/07/2015 8:18 AM

## 2015-09-24 ENCOUNTER — Ambulatory Visit: Payer: Medicare Other | Admitting: Family

## 2015-10-03 ENCOUNTER — Ambulatory Visit: Payer: Medicare Other | Admitting: Family

## 2015-10-22 ENCOUNTER — Emergency Department: Payer: Medicare Other

## 2015-10-22 ENCOUNTER — Encounter: Payer: Self-pay | Admitting: Emergency Medicine

## 2015-10-22 ENCOUNTER — Emergency Department
Admission: EM | Admit: 2015-10-22 | Discharge: 2015-10-22 | Disposition: A | Discharge status: Home / Self Care | Payer: Medicare Other | Attending: Emergency Medicine | Admitting: Emergency Medicine

## 2015-10-22 DIAGNOSIS — I110 Hypertensive heart disease with heart failure: Secondary | ICD-10-CM | POA: Diagnosis not present

## 2015-10-22 DIAGNOSIS — I509 Heart failure, unspecified: Secondary | ICD-10-CM | POA: Insufficient documentation

## 2015-10-22 DIAGNOSIS — Z950 Presence of cardiac pacemaker: Secondary | ICD-10-CM | POA: Insufficient documentation

## 2015-10-22 DIAGNOSIS — Z7901 Long term (current) use of anticoagulants: Secondary | ICD-10-CM | POA: Diagnosis not present

## 2015-10-22 DIAGNOSIS — R0602 Shortness of breath: Secondary | ICD-10-CM

## 2015-10-22 DIAGNOSIS — I4891 Unspecified atrial fibrillation: Secondary | ICD-10-CM | POA: Diagnosis not present

## 2015-10-22 DIAGNOSIS — Z79899 Other long term (current) drug therapy: Secondary | ICD-10-CM | POA: Insufficient documentation

## 2015-10-22 DIAGNOSIS — Z87891 Personal history of nicotine dependence: Secondary | ICD-10-CM | POA: Insufficient documentation

## 2015-10-22 LAB — COMPREHENSIVE METABOLIC PANEL
ALBUMIN: 3.7 g/dL (ref 3.5–5.0)
ALT: 11 U/L — AB (ref 14–54)
AST: 29 U/L (ref 15–41)
Alkaline Phosphatase: 58 U/L (ref 38–126)
Anion gap: 6 (ref 5–15)
BUN: 26 mg/dL — AB (ref 6–20)
CHLORIDE: 106 mmol/L (ref 101–111)
CO2: 27 mmol/L (ref 22–32)
CREATININE: 0.89 mg/dL (ref 0.44–1.00)
Calcium: 9.1 mg/dL (ref 8.9–10.3)
GFR calc Af Amer: >60 mL/min (ref >60)
GFR calc non Af Amer: 55 mL/min — ABNORMAL LOW (ref >60)
GLUCOSE: 117 mg/dL — AB (ref 65–99)
POTASSIUM: 5.3 mmol/L — AB (ref 3.5–5.1)
SODIUM: 139 mmol/L (ref 135–145)
Total Bilirubin: 0.8 mg/dL (ref 0.3–1.2)
Total Protein: 7.1 g/dL (ref 6.5–8.1)

## 2015-10-22 LAB — CBC
HCT: 36.9 % (ref 35.0–47.0)
Hemoglobin: 12.1 g/dL (ref 12.0–16.0)
MCH: 28.8 pg (ref 26.0–34.0)
MCHC: 32.8 g/dL (ref 32.0–36.0)
MCV: 87.6 fL (ref 80.0–100.0)
PLATELETS: 139 10*3/uL — AB (ref 150–440)
RBC: 4.21 MIL/uL (ref 3.80–5.20)
RDW: 14.6 % — AB (ref 11.5–14.5)
WBC: 5.6 10*3/uL (ref 3.6–11.0)

## 2015-10-22 LAB — TROPONIN I: Troponin I: <0.03 ng/mL (ref <0.03)

## 2015-10-22 LAB — BRAIN NATRIURETIC PEPTIDE: B Natriuretic Peptide: 2275 pg/mL — ABNORMAL HIGH (ref 0.0–100.0)

## 2015-10-22 MED ORDER — FUROSEMIDE 10 MG/ML IJ SOLN
20.0000 mg | Freq: Once | INTRAMUSCULAR | Status: AC
  Administered 2015-10-22: 20 mg via INTRAVENOUS
  Filled 2015-10-22: qty 4

## 2015-10-22 NOTE — ED Triage Notes (Signed)
Reports sob when lying flat.  No resp distress at this time.

## 2015-10-22 NOTE — ED Provider Notes (Signed)
[name] Provider Note  Time seen: 12:43 PM  I have reviewed the triage vital signs and the nursing notes.   HISTORY  Chief Complaint Shortness of Breath    HPI Brandi Aguirre is a 82 y.o. female with a past medical history of CHF, hypertension, atrial fibrillation, dyslipidemia, who presents the emergency department with shortness of breath. According to the patient for approximately the last 1 week she has been feeling progressively more short of breath. Denies any chest pain. Denies any leg swelling. Denies any cough, congestion or fever. Patient describes the chest pain as moderate. Patient is hard of hearing and mostly relies on writing to communicate.No distress.     History reviewed. No pertinent past medical history.  Patient Active Problem List   Diagnosis Date Noted  . CHF (congestive heart failure) (HCC) 09/04/2015  . HTN (hypertension) 09/04/2015  . Atrial fibrillation (HCC) 09/04/2015  . Dyslipidemia 09/04/2015  . HOH (hard of hearing) 09/04/2015    Past Surgical History:  Procedure Laterality Date  . COCHLEAR IMPLANT    . PACEMAKER INSERTION      Current Outpatient Rx  . Order #: [id number] Class: Historical Med  . Order #: [id number] Class: Print  . Order #: [id number] Class: Print  . Order #: [id number] Class: Historical Med  . Order #: [id number] Class: Print  . Order #: [id number] Class: Print  . Order #: [id number] Class: Historical Med  . Order #: [id number] Class: Print    Allergies Review of patient's allergies indicates no known allergies.  No family history on file.  Social History Social History  Substance Use Topics  . Smoking status: Former Games developer  . Smokeless tobacco: Never Used  . Alcohol use Not on file    Review of Systems Constitutional: Negative for fever. Cardiovascular: Negative for chest pain. Respiratory: Positive for shortness of breath. Gastrointestinal: Negative for abdominal pain,  vomiting Musculoskeletal: Negative for back pain. Neurological: Negative for headache 10-point ROS otherwise negative.  ____________________________________________   PHYSICAL EXAM:  VITAL SIGNS: ED Triage Vitals [10/22/15 1210]  Enc Vitals Group     BP (!) 139/49     Pulse Rate (!) 58     Resp 18     Temp 98.7 F (37.1 C)     Temp Source Oral     SpO2 96 %     Weight 110 lb (49.9 kg)     Height 5\' 5"  (1.651 m)     Head Circumference      Peak Flow      Pain Score      Pain Loc      Pain Edu?      Excl. in GC?     Constitutional: Alert and oriented. Well appearing and in no distress. Eyes: Normal exam ENT   Head: Normocephalic and atraumatic.   Mouth/Throat: Mucous membranes are moist. Cardiovascular: Normal rate, regular rhythm. No murmur Respiratory: Normal respiratory effort without tachypnea nor retractions. Breath sounds are clear and equal bilaterally. No wheezes/rales/rhonchi. Gastrointestinal: Soft and nontender. No distention.   Musculoskeletal: Nontender with normal range of motion in all extremities. No lower extremity tenderness or edema. Neurologic:  Normal speech and language. No gross focal neurologic deficits  Psychiatric: Mood and affect are normal. Speech and behavior are normal.   ____    EKG  EKG reviewed and interpreted by myself shows an atrial paced rhythm at 64 bpm, narrow QRS, normal axis, nonspecific ST changes.  ____________________________________________    RADIOLOGY  Chest x-ray shows a mild  amount of pulmonary edema  ____________________________________________   INITIAL IMPRESSION / ASSESSMENT AND PLAN / ED COURSE  Pertinent labs & imaging results that were available during my care of the patient were reviewed by me and considered in my medical decision making (see chart for details).  Patient presents to the emergency department shortness of breath. Patient denies any chest pain. Has no lower extremity edema or  calf tenderness. Lung sounds are clear, no obvious wheeze rales or rhonchi. We'll proceed with labs including troponin, BNP, obtain a chest x-ray as well as an EKG and closely monitor on cardiac monitoring in the emergency department.   Patient's chest x-ray shows a mild amount of pulmonary edema. Her room air saturation has been consistently around 94-96 percent on room air with a good waveform. Labs are normal including normal troponin. I discussed with the patient and her husband increasing her Lasix from 20 mg daily to 40 mg daily for the next 5 days and having her follow up with her primary care doctor in the next 1-2 days. They're agreeable to this plan. I discussed strict return precautions for any worsening trouble breathing or any chest pain.  ____________________________________________   FINAL CLINICAL IMPRESSION(S) / ED DIAGNOSES  Dyspnea Congestive heart failure exacerbation   Minna Antis, MD 10/22/15 1435

## 2015-10-22 NOTE — Discharge Instructions (Signed)
As we discussed please increase her Lasix (Furosemide) from 20 mg per day to 40 mg per day starting tomorrow 10/23/15 for the next 5 days, then you may decrease back to 1 tablet per day. Please follow-up with your primary care doctor tomorrow for recheck.

## 2016-08-30 IMAGING — CR DG CHEST 2V
2 series · 2 of 2 positions shown · non-contrast
Comparison: 09/04/2015

CLINICAL DATA: Nonproductive cough for shortness of breath.

EXAM:
CHEST  2 VIEW

[chest lat]
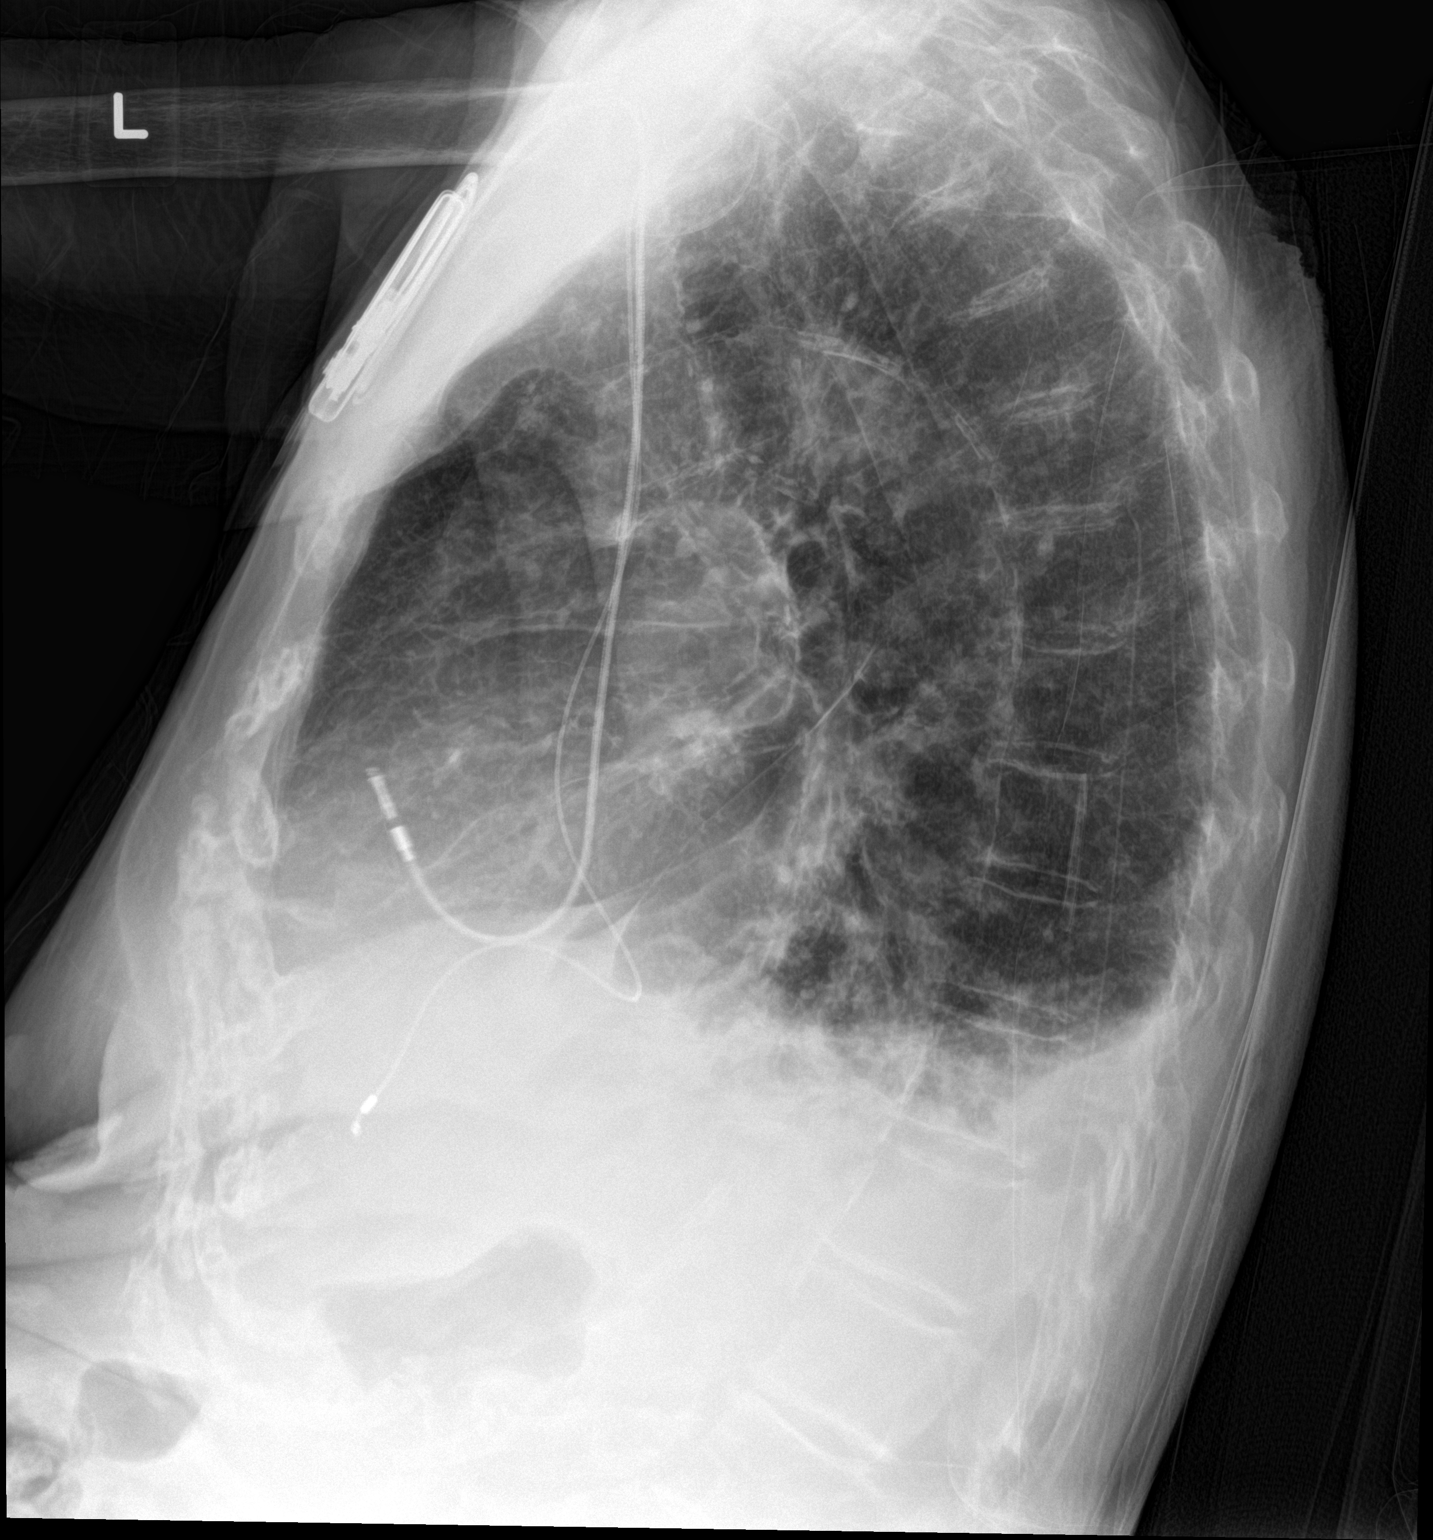

[chest ap]
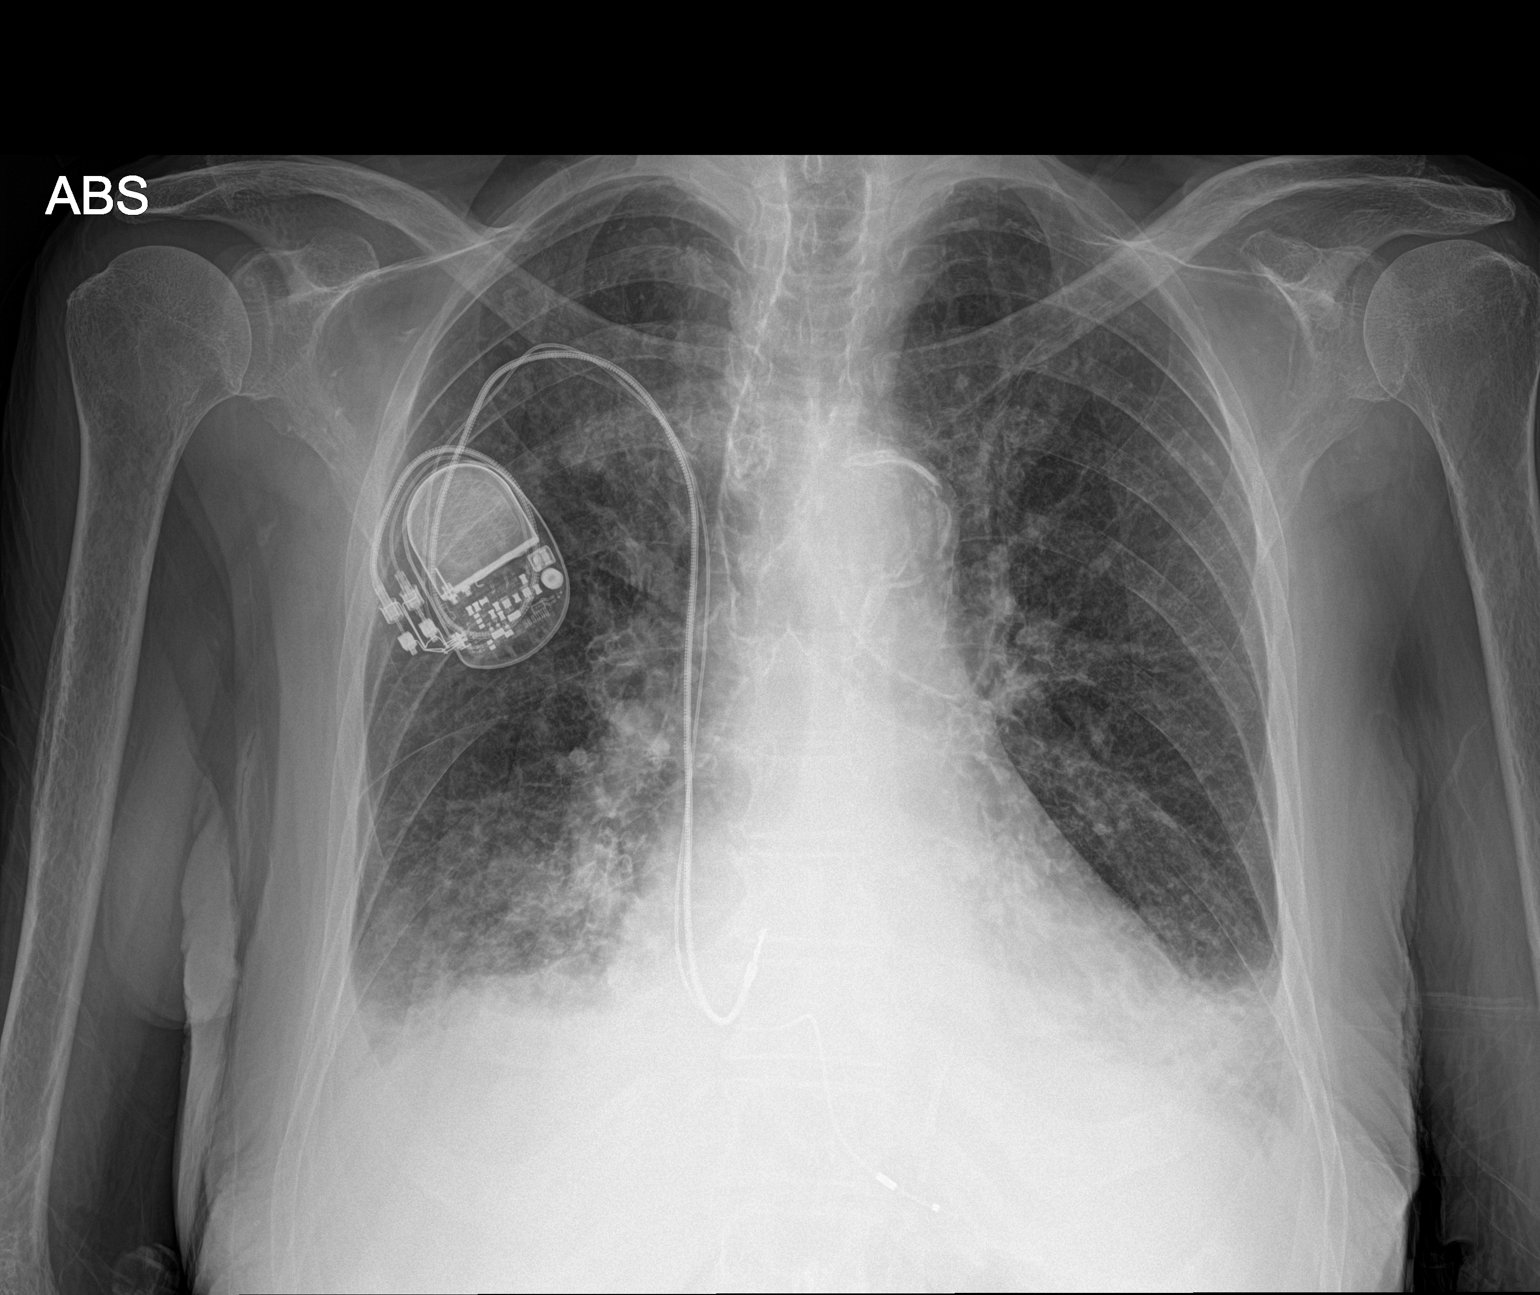

[2 of 2 positions shown; findings below may reference images not displayed]

FINDINGS: Diffuse interstitial opacity again noted with increase in bibasilar
interstitial and airspace disease. Small bilateral pleural effusions
are evident. The cardio pericardial silhouette is enlarged. Right
permanent pacemaker remains in place. Bones are diffusely
demineralized.
IMPRESSION: Interstitial prominence with bibasilar infiltrates and small
bilateral pleural effusions, likely reflecting pulmonary edema
superimposed on emphysema.

## 2021-01-05 ENCOUNTER — Encounter: Payer: Self-pay | Admitting: Ophthalmology

## 2021-01-08 NOTE — Discharge Instructions (Signed)

## 2021-01-16 ENCOUNTER — Encounter
Admission: RE | Disposition: A | Discharge status: Home / Self Care | Payer: Self-pay | Source: Ambulatory Visit | Attending: Ophthalmology

## 2021-01-16 ENCOUNTER — Ambulatory Visit: Payer: Medicare Other | Admitting: Anesthesiology

## 2021-01-16 ENCOUNTER — Other Ambulatory Visit: Payer: Self-pay

## 2021-01-16 ENCOUNTER — Ambulatory Visit
Admission: RE | Admit: 2021-01-16 | Discharge: 2021-01-16 | Disposition: A | Discharge status: Home / Self Care | Payer: Medicare Other | Source: Ambulatory Visit | Attending: Ophthalmology | Admitting: Ophthalmology

## 2021-01-16 ENCOUNTER — Encounter: Payer: Self-pay | Admitting: Ophthalmology

## 2021-01-16 DIAGNOSIS — H02132 Senile ectropion of right lower eyelid: Secondary | ICD-10-CM | POA: Insufficient documentation

## 2021-01-16 DIAGNOSIS — Z79899 Other long term (current) drug therapy: Secondary | ICD-10-CM | POA: Insufficient documentation

## 2021-01-16 DIAGNOSIS — Z950 Presence of cardiac pacemaker: Secondary | ICD-10-CM | POA: Insufficient documentation

## 2021-01-16 DIAGNOSIS — H04521 Eversion of right lacrimal punctum: Secondary | ICD-10-CM | POA: Diagnosis not present

## 2021-01-16 DIAGNOSIS — Z7901 Long term (current) use of anticoagulants: Secondary | ICD-10-CM | POA: Insufficient documentation

## 2021-01-16 DIAGNOSIS — Z87891 Personal history of nicotine dependence: Secondary | ICD-10-CM | POA: Insufficient documentation

## 2021-01-16 DIAGNOSIS — Z882 Allergy status to sulfonamides status: Secondary | ICD-10-CM | POA: Diagnosis not present

## 2021-01-16 HISTORY — DX: Presence of cardiac pacemaker: Z95.0

## 2021-01-16 HISTORY — DX: Cochlear implant status: Z96.21

## 2021-01-16 HISTORY — DX: Essential (primary) hypertension: I10

## 2021-01-16 HISTORY — DX: Sick sinus syndrome: I49.5

## 2021-01-16 HISTORY — DX: Unspecified atrial fibrillation: I48.91

## 2021-01-16 HISTORY — DX: Presence of dental prosthetic device (complete) (partial): Z97.2

## 2021-01-16 HISTORY — PX: ECTROPION REPAIR: SHX357

## 2021-01-16 SURGERY — REPAIR, ECTROPION, EYELID
Anesthesia: Monitor Anesthesia Care | Site: Eye | Laterality: Right

## 2021-01-16 MED ORDER — LIDOCAINE-EPINEPHRINE 2 %-1:100000 IJ SOLN
INTRAMUSCULAR | Status: DC | PRN
  Administered 2021-01-16: 2 mL via OPHTHALMIC

## 2021-01-16 MED ORDER — ACETAMINOPHEN 325 MG PO TABS
325.0000 mg | ORAL_TABLET | ORAL | Status: DC | PRN
  Administered 2021-01-16: 650 mg via ORAL

## 2021-01-16 MED ORDER — ALFENTANIL 500 MCG/ML IJ INJ
INJECTION | INTRAVENOUS | Status: DC | PRN
  Administered 2021-01-16: 200 ug via INTRAVENOUS
  Administered 2021-01-16: 500 ug via INTRAVENOUS

## 2021-01-16 MED ORDER — ERYTHROMYCIN 5 MG/GM OP OINT
TOPICAL_OINTMENT | OPHTHALMIC | Status: DC | PRN
  Administered 2021-01-16: 1 via OPHTHALMIC

## 2021-01-16 MED ORDER — TETRACAINE HCL 0.5 % OP SOLN
OPHTHALMIC | Status: DC | PRN
  Administered 2021-01-16: 2 [drp] via OPHTHALMIC

## 2021-01-16 MED ORDER — LACTATED RINGERS IV SOLN: INTRAVENOUS | Status: DC

## 2021-01-16 MED ORDER — BSS IO SOLN
INTRAOCULAR | Status: DC | PRN
  Administered 2021-01-16: 15 mL

## 2021-01-16 MED ORDER — ACETAMINOPHEN 160 MG/5ML PO SOLN: 325.0000 mg | ORAL | Status: DC | PRN

## 2021-01-16 MED ORDER — ERYTHROMYCIN 5 MG/GM OP OINT: TOPICAL_OINTMENT | OPHTHALMIC | 2 refills | Status: DC

## 2021-01-16 SURGICAL SUPPLY — 32 items
APPLICATOR COTTON TIP WD 3 STR (MISCELLANEOUS) ×4 IMPLANT
BLADE SURG 15 STRL LF DISP TIS (BLADE) ×1 IMPLANT
BLADE SURG 15 STRL SS (BLADE) ×2
CORD BIP STRL DISP 12FT (MISCELLANEOUS) ×2 IMPLANT
GAUZE SPONGE 4X4 12PLY STRL (GAUZE/BANDAGES/DRESSINGS) ×2 IMPLANT
MARKER SKIN XFINE TIP W/RULER (MISCELLANEOUS) ×2 IMPLANT
NEEDLE FILTER BLUNT 18X 1/2SAF (NEEDLE) ×1
NEEDLE FILTER BLUNT 18X1 1/2 (NEEDLE) ×1 IMPLANT
NEEDLE HYPO 30X.5 LL (NEEDLE) ×4 IMPLANT
PACK ENT CUSTOM (PACKS) ×2 IMPLANT
SOL PREP PVP 2OZ (MISCELLANEOUS) ×2
SOLUTION PREP PVP 2OZ (MISCELLANEOUS) ×1 IMPLANT
SPONGE GAUZE 2X2 8PLY STRL LF (GAUZE/BANDAGES/DRESSINGS) ×20 IMPLANT
SUT CHROMIC 4-0 (SUTURE) ×2
SUT CHROMIC 4-0 M2 12X2 ARM (SUTURE) ×1
SUT CHROMIC 5 0 P 3 (SUTURE) IMPLANT
SUT ETHILON 4 0 CL P 3 (SUTURE) IMPLANT
SUT GUT PLAIN 6-0 1X18 ABS (SUTURE) ×2 IMPLANT
SUT MERSILENE 4-0 S-2 (SUTURE) ×4 IMPLANT
SUT PDS AB 4-0 P3 18 (SUTURE) IMPLANT
SUT PROLENE 5 0 P 3 (SUTURE) IMPLANT
SUT PROLENE 6 0 P 1 18 (SUTURE) IMPLANT
SUT SILK 4 0 G 3 (SUTURE) IMPLANT
SUT VIC AB 5-0 P-3 18X BRD (SUTURE) IMPLANT
SUT VIC AB 5-0 P3 18 (SUTURE)
SUT VICRYL 6-0  S14 CTD (SUTURE)
SUT VICRYL 6-0 S14 CTD (SUTURE) IMPLANT
SUT VICRYL 7 0 TG140 8 (SUTURE) IMPLANT
SUTURE CHRMC 4-0 M2 12X2 ARM (SUTURE) ×1 IMPLANT
SYR 10ML LL (SYRINGE) ×2 IMPLANT
SYR 3ML LL SCALE MARK (SYRINGE) ×2 IMPLANT
WATER STERILE IRR 250ML POUR (IV SOLUTION) ×2 IMPLANT

## 2021-01-16 NOTE — Op Note (Signed)
Preoperative Diagnosis:   1.  Lower eyelid laxity with ectropion, right  lower eyelid(s). 2.  Punctal eversion right  lower eyelid(s)  Postoperative Diagnosis:   Same.  Procedure(s) Performed:  1.  Lateral tarsal strip procedure,  right   lower eyelid(s) 2.  Medial spindle (tarsal wedge excision) right  lower eyelid(s)  Surgeon: Philis Pique. Vickki Muff, M.D.  Assistants: none  Anesthesia: MAC  Specimens: None.  Estimated Blood Loss: Minimal.  Complications: None.  Operative Findings: None   Procedure:   Allergies were reviewed and the patient Sulfa antibiotics.    After discussing the risks, benefits, complications, and alternatives with the patient, appropriate informed consent was obtained. The patient was brought to the operating suite and reclined supine. Time out was conducted and the patient was sedated.  Local anesthetic consisting of a 50-50 mixture of 2% lidocaine with epinephrine and 0.75% bupivacaine with added Hylenex was injected subcutaneously to the right  lateral canthal region(s) and lower eyelid(s). Additional anesthetic was injected subconjunctivally to the right  lower eyelid(s). Finally, anesthetic was injected down to the periosteum of the right  lateral orbital rim(s).  After adequate local was instilled, the patient was prepped and draped in the usual sterile fashion for eyelid surgery. Attention was turned to the right  lateral canthal angle. Westcott scissors were used to create a lateral canthotomy. Hemostasis was obtained with bipolar cautery. An inferior cantholysis was then performed with additional bipolar hemostasis. The anterior and posterior lamella of the lid were divided for approximately 8 mm.  A strip of the epithelium was excised off the superior margin of the tarsal strip and conjunctiva and retractors were incised off the inferior margin of the tarsal strip.  Attention was then turned to the medial portion of the lid. An ellipse of conjunctiva,  retractors and inferior tarsal margin was excised inferior to the punctum. Hemostasis was obtained with bipolar cautery. A double-armed 4-0 chromic suture was then passed through the tarsal edge first then the conjunctival and retractor borders and finally from posterior to anterior through the skin and tied off. This provided nice inward rotation of the lower eyelid medially   A double-armed 4-0 Mersilene suture was then passed each arm through the terminal portion of the tarsal strip. Each arm of the suture was then passed through the periosteum of the inner portion of the lateral orbital rim at the level of Whitnall's tubercle. The sutures were advanced and this provided nice elevation and tightening of the lower eyelid. Once the suture was secured, a thin strip of follicle-bearing skin was excised. The lateral canthal angle was reformed with an interrupted 6-0 plain gut suture. Orbicularis was reapproximated with horizontal subcuticular 6-0  plain gut sutures. The skin was closed with interrupted 6-0 plain gut sutures.   Attention was then turned to the opposite eyelid where the same procedure was performed in the same manner.   The patient tolerated the procedure well. Erythromycin ophthalmic ointment was applied to the incision site(s) followed by ice packs. The patient was taken to the recovery area where she recovered without difficulty.  Post-Op Plan/Instructions:  The patient was instructed to use ice packs frequently for the next 48 hours. She was instructed to use Erythromycin ophthalmic ointment on her incisions 4 times a day for the next 12 to 14 days. She was given a prescription for tramadol (or similar) for pain control should Tylenol not be effective. She was asked to to follow up at the [facility] in [place],  [place] in 2-3 weeks' time or sooner as needed for problems.  Lekita Kerekes M. Vickki Muff, M.D. Ophthalmology

## 2021-01-16 NOTE — Transfer of Care (Signed)
Immediate Anesthesia Transfer of Care Note  Patient: Brandi Aguirre  Procedure(s) Performed: ECTROPION REPAIR, TARSAL WEDGE ECTROPION REPAIR, EXTENSIVE RIGHT (Right: Eye)  Patient Location: PACU  Anesthesia Type: MAC  Level of Consciousness: awake, alert  and patient cooperative  Airway and Oxygen Therapy: Patient Spontanous Breathing and Patient connected to supplemental oxygen  Post-op Assessment: Post-op Vital signs reviewed, Patient's Cardiovascular Status Stable, Respiratory Function Stable, Patent Airway and No signs of Nausea or vomiting  Post-op Vital Signs: Reviewed and stable  Complications: No notable events documented.

## 2021-01-16 NOTE — H&P (Signed)
[facility]: [facility]  Primary Care Physician:  Dione Housekeeper, MD Ophthalmologist: Dr. Hubbard Robinson. Ether Griffins, M.D.  Pre-Procedure History & Physical: HPI:  Brandi Aguirre is a 87 y.o. female here for periocular surgery.   Past Medical History:  Diagnosis Date   Atrial fibrillation (HCC)    Cochlear implant in place    Hypertension    Presence of permanent cardiac pacemaker    Sick sinus syndrome (HCC)    Wears dentures     Past Surgical History:  Procedure Laterality Date   ABDOMINAL HYSTERECTOMY     COCHLEAR IMPLANT     PACEMAKER INSERTION      Prior to Admission medications   Medication Sig Start Date End Date Taking? Authorizing Provider  diltiazem (CARDIZEM) 120 MG tablet Take 2 tablets by mouth 2 (two) times daily. 08/04/15  Yes [provider]  furosemide (LASIX) 20 MG tablet Take 1 tablet (20 mg total) by mouth daily. Patient taking differently: Take 10 mg by mouth daily. 09/07/15  Yes Wieting, Richard, MD  hydrALAZINE (APRESOLINE) 10 MG tablet Take 1 tablet (10 mg total) by mouth 3 (three) times daily. 09/07/15  Yes Wieting, Richard, MD  lisinopril (PRINIVIL,ZESTRIL) 2.5 MG tablet Take 1 tablet (2.5 mg total) by mouth daily. 09/07/15  Yes Wieting, Richard, MD  metoprolol (LOPRESSOR) 50 MG tablet Take 1 tablet (50 mg total) by mouth 2 (two) times daily. Patient taking differently: Take 50 mg by mouth daily. 09/07/15  Yes Wieting, Richard, MD  traZODone (DESYREL) 100 MG tablet Take 100 mg by mouth at bedtime.  08/04/15  Yes [provider]  warfarin (COUMADIN) 6 MG tablet Take 1 tablet (6 mg total) by mouth daily at 6 PM. Patient taking differently: Take 5 mg by mouth daily at 6 PM. 09/08/15  Yes Wieting, Richard, MD  isosorbide mononitrate (IMDUR) 60 MG 24 hr tablet Take 60 mg by mouth daily.  08/05/15   [provider]    Allergies as of 12/08/2020 - Review Complete 10/22/2015  Allergen Reaction Noted   Sulfa antibiotics Rash 07/10/2012     History reviewed. No pertinent family history.  Social History   Socioeconomic History   Marital status: Widowed    Spouse name: Not on file   Number of children: Not on file   Years of education: Not on file   Highest education level: Not on file  Occupational History   Not on file  Tobacco Use   Smoking status: Former    Types: Cigarettes   Smokeless tobacco: Never   Tobacco comments:    Quit smoking over 50 yrs ago  Vaping Use   Vaping Use: Never used  Substance and Sexual Activity   Alcohol use: Not Currently   Drug use: Not on file   Sexual activity: Not on file  Other Topics Concern   Not on file  Social History Narrative   Not on file   Social Determinants of Health   Financial Resource Strain: Not on file  Food Insecurity: Not on file  Transportation Needs: Not on file  Physical Activity: Not on file  Stress: Not on file  Social Connections: Not on file  Intimate Partner Violence: Not on file    Review of Systems: See HPI, otherwise negative ROS  Physical Exam: BP (!) 186/58   Pulse (!) 55   Temp 97.9 F (36.6 C) (Temporal)   Resp 18   Ht 5\' 5"  (1.651 m)   Wt 59.9 kg  SpO2 94%   BMI 21.97 kg/m  General:   Alert and cooperative in NAD Head:  Normocephalic and atraumatic. Respiratory:  Normal work of breathing.  Impression/Plan: Brandi Aguirre is here for periocular surgery.  Risks, benefits, limitations, and alternatives regarding surgery have been reviewed with the patient.  Questions have been answered.  All parties agreeable.   Imagene Riches, MD  01/16/2021, 10:31 AM

## 2021-01-16 NOTE — Anesthesia Postprocedure Evaluation (Signed)
Anesthesia Post Note  Patient: Brandi Aguirre  Procedure(s) Performed: ECTROPION REPAIR, TARSAL WEDGE ECTROPION REPAIR, EXTENSIVE RIGHT (Right: Eye)     Patient location during evaluation: PACU Anesthesia Type: MAC Level of consciousness: awake and alert Pain management: pain level controlled Vital Signs Assessment: post-procedure vital signs reviewed and stable Respiratory status: spontaneous breathing Cardiovascular status: stable Anesthetic complications: no   No notable events documented.  Gillian Scarce

## 2021-01-16 NOTE — Anesthesia Preprocedure Evaluation (Signed)
Anesthesia Evaluation  Patient identified by MRN, date of birth, ID band Patient awake    Reviewed: Allergy & Precautions, H&P , NPO status , Patient's Chart, lab work & pertinent test results  Airway Mallampati: II   Neck ROM: full    Dental no notable dental hx.    Pulmonary neg pulmonary ROS, former smoker,    Pulmonary exam normal        Cardiovascular hypertension, On Medications Normal cardiovascular exam+ pacemaker   Underlying sick sinus syndrome.  Paced.   Neuro/Psych    GI/Hepatic negative GI ROS, Neg liver ROS,   Endo/Other  negative endocrine ROS  Renal/GU negative Renal ROS  negative genitourinary   Musculoskeletal   Abdominal   Peds  Hematology negative hematology ROS (+)   Anesthesia Other Findings   Reproductive/Obstetrics                             Anesthesia Physical Anesthesia Plan  ASA: 3  Anesthesia Plan: MAC   Post-op Pain Management:    Induction:   PONV Risk Score and Plan: 2 and TIVA, Ondansetron and Treatment may vary due to age or medical condition  Airway Management Planned:   Additional Equipment:   Intra-op Plan:   Post-operative Plan:   Informed Consent: I have reviewed the patients History and Physical, chart, labs and discussed the procedure including the risks, benefits and alternatives for the proposed anesthesia with the patient or authorized representative who has indicated his/her understanding and acceptance.       Plan Discussed with:   Anesthesia Plan Comments:         Anesthesia Quick Evaluation

## 2021-01-16 NOTE — Anesthesia Procedure Notes (Signed)
Date/Time: 01/16/2021 10:59 AM Performed by: Maree Krabbe, CRNA Pre-anesthesia Checklist: Patient identified, Emergency Drugs available, Suction available, Timeout performed and Patient being monitored Patient Re-evaluated:Patient Re-evaluated prior to induction Oxygen Delivery Method: Nasal cannula Placement Confirmation: positive ETCO2

## 2021-01-16 NOTE — Interval H&P Note (Signed)
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  The various methods of treatment have been discussed with the patient and family. After consideration of risks, benefits and other options for treatment, the patient has consented to  Procedure(s): ECTROPION REPAIR, TARSAL WEDGE ECTROPION REPAIR, EXTENSIVE RIGHT (Right) as a surgical intervention.  The patient's history has been reviewed, patient examined, no change in status, stable for surgery.  I have reviewed the patient's chart and labs.  Questions were answered to the patient's satisfaction.     Ether Griffins, Genita Nilsson M

## 2021-01-19 ENCOUNTER — Encounter: Payer: Self-pay | Admitting: Ophthalmology

## 2021-07-20 ENCOUNTER — Emergency Department: Payer: Medicare Other

## 2021-07-20 ENCOUNTER — Emergency Department
Admission: EM | Admit: 2021-07-20 | Discharge: 2021-07-21 | Disposition: A | Discharge status: Home / Self Care | Payer: Medicare Other | Attending: Emergency Medicine | Admitting: Emergency Medicine

## 2021-07-20 ENCOUNTER — Other Ambulatory Visit: Payer: Self-pay

## 2021-07-20 ENCOUNTER — Encounter: Payer: Self-pay | Admitting: Emergency Medicine

## 2021-07-20 DIAGNOSIS — Z20822 Contact with and (suspected) exposure to covid-19: Secondary | ICD-10-CM | POA: Insufficient documentation

## 2021-07-20 DIAGNOSIS — Y92009 Unspecified place in unspecified non-institutional (private) residence as the place of occurrence of the external cause: Secondary | ICD-10-CM

## 2021-07-20 DIAGNOSIS — W19XXXA Unspecified fall, initial encounter: Secondary | ICD-10-CM | POA: Diagnosis not present

## 2021-07-20 DIAGNOSIS — M25561 Pain in right knee: Secondary | ICD-10-CM | POA: Diagnosis not present

## 2021-07-20 DIAGNOSIS — I509 Heart failure, unspecified: Secondary | ICD-10-CM | POA: Diagnosis not present

## 2021-07-20 DIAGNOSIS — R531 Weakness: Secondary | ICD-10-CM | POA: Diagnosis not present

## 2021-07-20 DIAGNOSIS — H9190 Unspecified hearing loss, unspecified ear: Secondary | ICD-10-CM

## 2021-07-20 DIAGNOSIS — Z950 Presence of cardiac pacemaker: Secondary | ICD-10-CM | POA: Insufficient documentation

## 2021-07-20 DIAGNOSIS — I110 Hypertensive heart disease with heart failure: Secondary | ICD-10-CM | POA: Diagnosis not present

## 2021-07-20 DIAGNOSIS — I10 Essential (primary) hypertension: Secondary | ICD-10-CM | POA: Diagnosis not present

## 2021-07-20 DIAGNOSIS — I4891 Unspecified atrial fibrillation: Secondary | ICD-10-CM | POA: Diagnosis present

## 2021-07-20 DIAGNOSIS — M79604 Pain in right leg: Secondary | ICD-10-CM | POA: Diagnosis not present

## 2021-07-20 LAB — PROTIME-INR
INR: 1.7 — ABNORMAL HIGH (ref 0.8–1.2)
Prothrombin Time: 20.2 seconds — ABNORMAL HIGH (ref 11.4–15.2)

## 2021-07-20 LAB — URINALYSIS, ROUTINE W REFLEX MICROSCOPIC
Bilirubin Urine: NEGATIVE
Glucose, UA: NEGATIVE mg/dL
Ketones, ur: NEGATIVE mg/dL
Leukocytes,Ua: NEGATIVE
Nitrite: POSITIVE — AB
Protein, ur: 100 mg/dL — AB
Specific Gravity, Urine: 1.004 — ABNORMAL LOW (ref 1.005–1.030)
pH: 8 (ref 5.0–8.0)

## 2021-07-20 LAB — CBC WITH DIFFERENTIAL/PLATELET
Abs Immature Granulocytes: 0.03 10*3/uL (ref 0.00–0.07)
Basophils Absolute: 0.1 10*3/uL (ref 0.0–0.1)
Basophils Relative: 1 %
Eosinophils Absolute: 0.1 10*3/uL (ref 0.0–0.5)
Eosinophils Relative: 2 %
HCT: 47 % — ABNORMAL HIGH (ref 36.0–46.0)
Hemoglobin: 14.8 g/dL (ref 12.0–15.0)
Immature Granulocytes: 0 %
Lymphocytes Relative: 23 %
Lymphs Abs: 1.9 10*3/uL (ref 0.7–4.0)
MCH: 30.2 pg (ref 26.0–34.0)
MCHC: 31.5 g/dL (ref 30.0–36.0)
MCV: 95.9 fL (ref 80.0–100.0)
Monocytes Absolute: 0.7 10*3/uL (ref 0.1–1.0)
Monocytes Relative: 9 %
Neutro Abs: 5.4 10*3/uL (ref 1.7–7.7)
Neutrophils Relative %: 65 %
Platelets: 174 10*3/uL (ref 150–400)
RBC: 4.9 MIL/uL (ref 3.87–5.11)
RDW: 13.6 % (ref 11.5–15.5)
WBC: 8.2 10*3/uL (ref 4.0–10.5)
nRBC: 0 % (ref 0.0–0.2)

## 2021-07-20 LAB — COMPREHENSIVE METABOLIC PANEL
ALT: 9 U/L (ref 0–44)
AST: 16 U/L (ref 15–41)
Albumin: 4 g/dL (ref 3.5–5.0)
Alkaline Phosphatase: 70 U/L (ref 38–126)
Anion gap: 8 (ref 5–15)
BUN: 16 mg/dL (ref 8–23)
CO2: 32 mmol/L (ref 22–32)
Calcium: 8.9 mg/dL (ref 8.9–10.3)
Chloride: 104 mmol/L (ref 98–111)
Creatinine, Ser: 0.97 mg/dL (ref 0.44–1.00)
GFR, Estimated: 53 mL/min — ABNORMAL LOW (ref >60)
Glucose, Bld: 100 mg/dL — ABNORMAL HIGH (ref 70–99)
Potassium: 4.3 mmol/L (ref 3.5–5.1)
Sodium: 144 mmol/L (ref 135–145)
Total Bilirubin: 0.6 mg/dL (ref 0.3–1.2)
Total Protein: 7.4 g/dL (ref 6.5–8.1)

## 2021-07-20 LAB — RESP PANEL BY RT-PCR (FLU A&B, COVID) ARPGX2
Influenza A by PCR: NEGATIVE
Influenza B by PCR: NEGATIVE
SARS Coronavirus 2 by RT PCR: NEGATIVE

## 2021-07-20 MED ORDER — TRAZODONE HCL 100 MG PO TABS
100.0000 mg | ORAL_TABLET | Freq: Every day | ORAL | Status: DC
  Administered 2021-07-20: 100 mg via ORAL
  Filled 2021-07-20: qty 1

## 2021-07-20 MED ORDER — WARFARIN SODIUM 3 MG PO TABS: 3.0000 mg | ORAL_TABLET | Freq: Once | ORAL | Status: DC

## 2021-07-20 MED ORDER — HYDRALAZINE HCL 10 MG PO TABS
10.0000 mg | ORAL_TABLET | Freq: Once | ORAL | Status: AC
  Administered 2021-07-20: 10 mg via ORAL
  Filled 2021-07-20: qty 1

## 2021-07-20 MED ORDER — ISOSORBIDE MONONITRATE ER 60 MG PO TB24: 60.0000 mg | ORAL_TABLET | Freq: Every day | ORAL | Status: DC

## 2021-07-20 MED ORDER — WARFARIN - PHARMACIST DOSING INPATIENT
Freq: Every day | Status: DC
  Filled 2021-07-20: qty 1

## 2021-07-20 MED ORDER — WARFARIN SODIUM 5 MG PO TABS: 5.0000 mg | ORAL_TABLET | Freq: Once | ORAL | Status: DC

## 2021-07-20 MED ORDER — ISOSORBIDE MONONITRATE ER 60 MG PO TB24
60.0000 mg | ORAL_TABLET | Freq: Every day | ORAL | Status: DC
  Administered 2021-07-20 – 2021-07-21 (×2): 60 mg via ORAL
  Filled 2021-07-20 (×2): qty 1

## 2021-07-20 MED ORDER — DILTIAZEM HCL 60 MG PO TABS
120.0000 mg | ORAL_TABLET | Freq: Two times a day (BID) | ORAL | Status: DC
  Administered 2021-07-20 – 2021-07-21 (×2): 120 mg via ORAL
  Filled 2021-07-20 (×2): qty 2

## 2021-07-20 MED ORDER — DILTIAZEM HCL 60 MG PO TABS
120.0000 mg | ORAL_TABLET | Freq: Once | ORAL | Status: AC
  Administered 2021-07-20: 120 mg via ORAL
  Filled 2021-07-20: qty 2

## 2021-07-20 MED ORDER — METOPROLOL TARTRATE 50 MG PO TABS
50.0000 mg | ORAL_TABLET | Freq: Once | ORAL | Status: AC
  Administered 2021-07-20: 50 mg via ORAL
  Filled 2021-07-20: qty 1

## 2021-07-20 MED ORDER — LISINOPRIL 5 MG PO TABS
2.5000 mg | ORAL_TABLET | Freq: Every day | ORAL | Status: DC
  Administered 2021-07-21: 2.5 mg via ORAL
  Filled 2021-07-20: qty 1

## 2021-07-20 MED ORDER — WARFARIN SODIUM 5 MG PO TABS
5.0000 mg | ORAL_TABLET | Freq: Once | ORAL | Status: AC
  Administered 2021-07-20: 5 mg via ORAL
  Filled 2021-07-20: qty 1

## 2021-07-20 MED ORDER — METOPROLOL TARTRATE 50 MG PO TABS
50.0000 mg | ORAL_TABLET | Freq: Two times a day (BID) | ORAL | Status: DC
  Administered 2021-07-20 – 2021-07-21 (×2): 50 mg via ORAL
  Filled 2021-07-20 (×2): qty 1

## 2021-07-20 MED ORDER — HYDRALAZINE HCL 10 MG PO TABS
10.0000 mg | ORAL_TABLET | Freq: Three times a day (TID) | ORAL | Status: DC
  Administered 2021-07-20 – 2021-07-21 (×2): 10 mg via ORAL
  Filled 2021-07-20 (×4): qty 1

## 2021-07-20 MED ORDER — LISINOPRIL 5 MG PO TABS
2.5000 mg | ORAL_TABLET | Freq: Once | ORAL | Status: AC
  Administered 2021-07-20: 2.5 mg via ORAL
  Filled 2021-07-20: qty 1

## 2021-07-20 NOTE — Evaluation (Signed)
Physical Therapy Evaluation ?Patient Details ?Name: Brandi Aguirre ?MRN: [id number] ?DOB: 02/16/1922 ?Today's Date: 07/20/2021 ? ?History of Present Illness ? Patient is a 88 y.o. female past medical history of atrial fibrillation, sick sinus syndrome with pacemaker status post cochlear implant, hypertension who presents after an unwitnessed mechanical fall at her independent living facility. Multiple images obtained with no acute process.  ?Clinical Impression ? Patient is agreeable to PT evaluation. She is very hard of hearing and has difficulty reading lips. She is able to follow commands with multi-modal cueing, including gestures and tactile/verbal cues. Daughter at the bedside provided prior level of function information and reports patient lives alone in independent living. She normally ambulates with a 4 wheeled walker.  ?Today, the patient does not complain of pain. She needs assistance with bed mobility, to stand from bed and from toilet, and steadying assistance while ambulating 1ft in the room using rolling walker. She does appear to have generalized weakness and is fatigued after walking. She is not at her baseline level of functional mobility and her daughter reports she will be unable to physically assist the patient at discharge. Recommend SNF placement at this time for ongoing PT to maximize independence and facilitate return to prior level of function. PT will continue to follow while in the hospital.  ?   ? ?Recommendations for follow up therapy are one component of a multi-disciplinary discharge planning process, led by the attending physician.  Recommendations may be updated based on patient status, additional functional criteria and insurance authorization. ? ?Follow Up Recommendations Skilled nursing-short term rehab (<3 hours/day) ? ?  ?Assistance Recommended at Discharge Frequent or constant Supervision/Assistance  ?Patient can return home with the following ? A lot of help with walking and/or  transfers;A little help with bathing/dressing/bathroom;Help with stairs or ramp for entrance;Assist for transportation ? ?  ?Equipment Recommendations None recommended by PT  ?Recommendations for Other Services ?    ?  ?Functional Status Assessment Patient has had a recent decline in their functional status and demonstrates the ability to make significant improvements in function in a reasonable and predictable amount of time.  ? ?  ?Precautions / Restrictions Precautions ?Precautions: Fall ?Restrictions ?Weight Bearing Restrictions: No  ? ?  ? ?Mobility ? Bed Mobility ?Overal bed mobility: Needs Assistance ?Bed Mobility: Supine to Sit, Sit to Supine ?  ?  ?Supine to sit: Min assist ?Sit to supine: Min assist ?  ?General bed mobility comments: assistance for trunk support. cues for technique ?  ? ?Transfers ?Overall transfer level: Needs assistance ?Equipment used: Rolling walker (2 wheels) ?Transfers: Sit to/from Stand ?Sit to Stand: Mod assist ?  ?  ?  ?  ?  ?General transfer comment: for standing from stretcher and from toilet ?  ? ?Ambulation/Gait ?Ambulation/Gait assistance: Min assist ?Gait Distance (Feet): 25 Feet ?Assistive device: Rolling walker (2 wheels) ?Gait Pattern/deviations: Trunk flexed, Narrow base of support ?Gait velocity: decreased ?  ?  ?General Gait Details: cues for safety using rolling walker. steadying assistance needed. patient is usually ambulatory with rolling walker independently at home ? ?Stairs ?  ?  ?  ?  ?  ? ?Wheelchair Mobility ?  ? ?Modified Rankin (Stroke Patients Only) ?  ? ?  ? ?Balance Overall balance assessment: Needs assistance, History of Falls ?Sitting-balance support: Feet supported ?Sitting balance-Leahy Scale: Fair ?  ?  ?Standing balance support: Bilateral upper extremity supported, During functional activity ?Standing balance-Leahy Scale: Poor ?Standing balance comment: Min A required for steadying. ?  ?  ?  ?  ?  ?  ?  ?  ?  ?  ?  ?   ? ? ? ?  Pertinent Vitals/Pain  Pain Assessment ?Pain Assessment: No/denies pain  ? ? ?Home Living Family/patient expects to be discharged to:: Other (Comment) (independent living) ?Living Arrangements: Alone ?Available Help at Discharge: Family;Available PRN/intermittently ?Type of Home: Independent living facility ?Home Access: Level entry ?  ?  ?  ?Home Layout: One level ?Home Equipment: Rollator (4 wheels);Grab bars - toilet ?Additional Comments: daughter provided all information. she does not know of any other falls within the past 6 months  ?  ?Prior Function Prior Level of Function : Independent/Modified Independent ?  ?  ?  ?  ?  ?  ?Mobility Comments: using a 4 wheeled walker most of the time. when in the community, she uses a cane to walk short distance and daughter gets a wheelchair when going to the doctor, etc. ?ADLs Comments: independent; daughter performs cleaning, laundry, driving ?  ? ? ?Hand Dominance  ?   ? ?  ?Extremity/Trunk Assessment  ? Upper Extremity Assessment ?Upper Extremity Assessment: Generalized weakness ?  ? ?Lower Extremity Assessment ?Lower Extremity Assessment: Generalized weakness (endurance impaired for prolonged activity in standing) ?  ? ?   ?Communication  ? Communication: HOH (difficulty with reading lips also.)  ?Cognition Arousal/Alertness: Awake/alert ?Behavior During Therapy: [facility] for tasks assessed/performed ?Overall Cognitive Status: Within Functional Limits for tasks assessed ?  ?  ?  ?  ?  ?  ?  ?  ?  ?  ?  ?  ?  ?  ?  ?  ?General Comments: patient is able to follow all commands with gestures, extra time, verbal/tactile cues. ?  ?  ? ?  ?General Comments   ? ?  ?Exercises    ? ?Assessment/Plan  ?  ?PT Assessment Patient needs continued PT services  ?PT Problem List Decreased strength;Decreased activity tolerance;Decreased balance;Decreased mobility;Decreased safety awareness ? ?   ?  ?PT Treatment Interventions DME instruction;Gait training;Functional mobility training;Therapeutic  activities;Therapeutic exercise;Balance training;Neuromuscular re-education;Patient/family education   ? ?PT Goals (Current goals can be found in the Care Plan section)  ?Acute Rehab PT Goals ?Patient Stated Goal: daughter does not want patient to return home alone. patient unable to state goals ?PT Goal Formulation: With patient/family ?Time For Goal Achievement: 08/03/21 ?Potential to Achieve Goals: Fair ? ?  ?Frequency Min 2X/week ?  ? ? ?Co-evaluation   ?  ?  ?  ?  ? ? ?  ?AM-PAC PT "6 Clicks" Mobility  ?Outcome Measure Help needed turning from your back to your side while in a flat bed without using bedrails?: A Little ?Help needed moving from lying on your back to sitting on the side of a flat bed without using bedrails?: A Little ?Help needed moving to and from a bed to a chair (including a wheelchair)?: A Lot ?Help needed standing up from a chair using your arms (e.g., wheelchair or bedside chair)?: A Lot ?Help needed to walk in hospital room?: A Little ?Help needed climbing 3-5 steps with a railing? : Total ?6 Click Score: 14 ? ?  ?End of Session Equipment Utilized During Treatment: Gait belt ?Activity Tolerance: No increased pain;Patient tolerated treatment well ?Patient left: in bed;with call bell/phone within reach;with bed alarm set;with SCD's reapplied;with family/visitor present (daughter at the bedside) ?Nurse Communication: Mobility status ?PT Visit Diagnosis: Unsteadiness on feet (R26.81);Muscle weakness (generalized) (M62.81);History of falling (Z91.81) ?  ? ?Time: 8466-5993 ?PT Time Calculation (min) (ACUTE ONLY): 31 min ? ? ?Charges:   PT Evaluation ?$PT Eval Low Complexity: 1 Low ?PT Treatments ?$  Gait Training: 8-22 mins ?  ?   ? ? ?Donna Bernardracy Rainn Zupko, PT, MPT ? ? ?Ina Homesracy V Daneisha Surges ?07/20/2021, 3:32 PM ? ?

## 2021-07-20 NOTE — ED Triage Notes (Signed)
Pt via EMS from home. Pt c/o mechanical fall this AM. Pt c/o lower back and L knee pain. Pt has a hx of HTN. Pt is HOH. Denies any head injury. States she is on Warfarin. Pt is A&Ox4 and NAD.  ? ?208/82 BP ?58 HR  ?94% on RA ?16 RR  ? ?

## 2021-07-20 NOTE — Consult Note (Signed)
?Consult Note ? ? ?Patient: Brandi Aguirre [id number] DOB: [birth year] ?DOA: 07/20/2021 ?DOS: the patient was seen and examined on 07/20/2021 ?PCP: Valera Castle, MD  ?Patient coming from: ALF/ILF; NOK: Daughter-in-law, Weston Settle, [phone] ? ? ?Chief Complaint: Fall ? ?HPI: Brandi Aguirre is a 88 y.o. female with medical history significant of afib on Coumadin; HTN; and pacemaker placement presenting with a fall.   The patient is incredibly [facility] and so history was difficult.  She reports that she got out of bed and was maneuvering around the room with her walker when she suffered an apparent mechanical fall.  She does not complain of any specific pain or injuries, just generalized weakness. ? ? ? ?ER Course:  Patient with weakness, fall.  Usually walks with a walker.  Evaluation negative.  Extremely unsteady while walking.  HTN - did not take home meds.  Almost 2L UOP in the ER. Urine culture pending.  PT  ? ? ? ? ?Review of Systems: unable to review all systems due to the inability of the patient to answer questions.  She could not hear anything asked or read lips. ? ?Past Medical History:  ?Diagnosis Date  ? Atrial fibrillation ([place])   ? Cochlear implant in place   ? Hypertension   ? Presence of permanent cardiac pacemaker   ? Sick sinus syndrome ([place])   ? Wears dentures   ? ?Past Surgical History:  ?Procedure Laterality Date  ? ABDOMINAL HYSTERECTOMY    ? COCHLEAR IMPLANT    ? ECTROPION REPAIR Right 01/16/2021  ? Procedure: ECTROPION REPAIR, TARSAL WEDGE ECTROPION REPAIR, EXTENSIVE RIGHT;  Surgeon: Karle Starch, MD;  Location: [place];  Service: Ophthalmology;  Laterality: Right;  ? PACEMAKER INSERTION    ? ?Social History:  reports that she has quit smoking. Her smoking use included cigarettes. She has never used smokeless tobacco. She reports that she does not currently use alcohol. No history on file for drug use. ? ?Allergies  ?Allergen Reactions  ? Sulfa Antibiotics Rash   ? ? ?History reviewed. No pertinent family history. ? ?Prior to Admission medications   ?Medication Sig Start Date End Date Taking? Authorizing Provider  ?diltiazem (CARDIZEM) 120 MG tablet Take 2 tablets by mouth 2 (two) times daily. 08/04/15   [provider]  ?erythromycin ophthalmic ointment Apply to sutures 4 times a day for 10-12 days.  Discontinue if allergy develops and call our office 01/16/21   Karle Starch, MD  ?furosemide (LASIX) 20 MG tablet Take 1 tablet (20 mg total) by mouth daily. ?Patient taking differently: Take 10 mg by mouth daily. 09/07/15   Loletha Grayer, MD  ?hydrALAZINE (APRESOLINE) 10 MG tablet Take 1 tablet (10 mg total) by mouth 3 (three) times daily. 09/07/15   Loletha Grayer, MD  ?isosorbide mononitrate (IMDUR) 60 MG 24 hr tablet Take 60 mg by mouth daily.  08/05/15   [provider]  ?lisinopril (PRINIVIL,ZESTRIL) 2.5 MG tablet Take 1 tablet (2.5 mg total) by mouth daily. 09/07/15   Loletha Grayer, MD  ?metoprolol (LOPRESSOR) 50 MG tablet Take 1 tablet (50 mg total) by mouth 2 (two) times daily. ?Patient taking differently: Take 50 mg by mouth daily. 09/07/15   Loletha Grayer, MD  ?traZODone (DESYREL) 100 MG tablet Take 100 mg by mouth at bedtime.  08/04/15   [provider]  ?warfarin (COUMADIN) 5 MG tablet Take 5 mg by mouth daily. 06/04/21   [provider]  ?warfarin (COUMADIN) 6 MG tablet Take 1  tablet (6 mg total) by mouth daily at 6 PM. ?Patient taking differently: Take 5 mg by mouth daily at 6 PM. 09/08/15   Loletha Grayer, MD  ? ? ?Physical Exam: ?Vitals:  ? 07/20/21 1300 07/20/21 1330 07/20/21 1400 07/20/21 1401  ?BP: (!) 207/70 (!) 227/77 (!) 246/91 (!) 232/80  ?Pulse: (!) 56 (!) 58 (!) 58   ?Resp: 19 16    ?Temp:      ?TempSrc:      ?SpO2: 92% 95%    ?Weight:      ?Height:      ? ?General:  Appears calm and comfortable and is in NAD ?Eyes:  B ectropion, EOMI, normal iris ?ENT:   extremely hard of hearing, grossly normal lips & tongue,  mmm ?Neck:  no LAD, masses or thyromegaly ?Cardiovascular:  RR with mild bradycardia, no m/r/g. No LE edema.  ?Respiratory:   CTA bilaterally with no wheezes/rales/rhonchi.  Normal respiratory effort. ?Abdomen:  soft, NT, ND ?Skin:  no rash or induration seen on limited exam ?Musculoskeletal:  grossly normal tone BUE/BLE, good ROM, no bony abnormality ?Psychiatric:  grossly normal mood and affect, speech fluent and appropriate but she is unable to answer any questions because she can't hear them ?Neurologic:  CN 2-12 grossly intact with very limited exam, moves all extremities in coordinated fashion ? ? ?Radiological Exams on Admission: ?Independently reviewed - see discussion in A/P where applicable ? ?DG Chest 1 View ? ?Result Date: 07/20/2021 ?CLINICAL DATA:  Fall EXAM: CHEST  1 VIEW COMPARISON:  Chest x-ray dated September 22, 2015 FINDINGS: Cardiac and mediastinal contours are within normal limits for AP technique. Right chest wall dual lead pacer with unchanged lead position. No focal consolidation. No large pleural effusion pneumothorax. No displaced rib fracture. IMPRESSION: No evidence of pneumothorax or displaced rib fracture. Electronically Signed   By: Yetta Glassman M.D.   On: 07/20/2021 10:45  ? ?DG Knee 2 Views Right ? ?Result Date: 07/20/2021 ?CLINICAL DATA:  Patient fell this morning.  Left knee pain. EXAM: RIGHT KNEE - 1-2 VIEW COMPARISON:  None. FINDINGS: Two view study. Bones are demineralized. No fracture or dislocation evident. No findings of joint effusion. Meniscal calcification noted in the medial and lateral compartments. No worrisome lytic or sclerotic osseous abnormality. IMPRESSION: 1. No acute bony abnormality. 2. Meniscal calcification consistent with chondrocalcinosis. Electronically Signed   By: Misty Stanley M.D.   On: 07/20/2021 10:46  ? ?DG Tibia/Fibula Right ? ?Result Date: 07/20/2021 ?CLINICAL DATA:  Mechanical fall.  Left knee pain. EXAM: RIGHT TIBIA AND FIBULA - 2 VIEW COMPARISON:   No comparison studies available. FINDINGS: Bones are diffusely demineralized. No evidence for an acute fracture in the tibia or fibula. No worrisome lytic or sclerotic osseous abnormality. IMPRESSION: Negative. Electronically Signed   By: Misty Stanley M.D.   On: 07/20/2021 10:47  ? ?CT HEAD WO CONTRAST (5MM) ? ?Result Date: 07/20/2021 ?CLINICAL DATA:  Head trauma, minor (Age >= 65y) EXAM: CT HEAD WITHOUT CONTRAST TECHNIQUE: Contiguous axial images were obtained from the base of the skull through the vertex without intravenous contrast. RADIATION DOSE REDUCTION: This exam was performed according to the departmental dose-optimization program which includes automated exposure control, adjustment of the mA and/or kV according to patient size and/or use of iterative reconstruction technique. COMPARISON:  None. FINDINGS: Brain: No evidence of acute large vascular territory infarction, hemorrhage, hydrocephalus, extra-axial collection or intraparenchymal mass lesion/mass effect. Approximately 2.3 x 1.0 x 1.0 cm partially calcified extra-axial dural-based mass  along the high left posterior parietal convexity which abuts the superior sagittal sinus, most likely a meningioma. Remote right thalamic lacunar infarct. Vascular: No hyperdense vessel identified. Calcific intracranial atherosclerosis. Skull: No acute fracture. Sinuses/Orbits: Mild paranasal sinus mucosal thickening. No acute orbital findings. Other: Right-sided cochlear implant with associated streak artifact limiting assessment of the adjacent brain. Bilateral mastoid effusions. IMPRESSION: 1. No evidence of acute intracranial abnormality. 2. Approximately 2.3 cm calcified extra-axial dural-based mass along the high left posterior parietal convexity which abuts the superior sagittal sinus, most likely a meningioma. 3. Remote right thalamic lacunar infarct. 4. Right cochlear implant with bilateral mastoid effusions. Electronically Signed   By: Margaretha Sheffield M.D.    On: 07/20/2021 10:42  ? ?CT Cervical Spine Wo Contrast ? ?Result Date: 07/20/2021 ?CLINICAL DATA:  Neck trauma (Age >= 65y) EXAM: CT CERVICAL SPINE WITHOUT CONTRAST TECHNIQUE: Multidetector CT imaging of the cer

## 2021-07-20 NOTE — ED Provider Notes (Signed)
? ?[facility] ?Provider Note ? ? ? Event Date/Time  ? First MD Initiated Contact with Patient 07/20/21 [phone]   ?  (approximate) ? ? ?History  ? ?Fall ? ? ?HPI ? ?Senetra Dillin is a 88 y.o. female past medical history of atrial fibrillation, sick sinus syndrome with pacemaker status post cochlear implant, hypertension who presents after a fall.  Fall was unwitnessed.  Patient resides at an assisted living.  Was found on the ground.  Patient very hard of hearing making history taking difficult.  Says she did hit her head says when she is not moving does not have pain when she moves seems to have pain in the right knee and right leg.  Initially had back pain but denies back pain currently.  Denies chest pain or difficulty breathing no numbness tingling weakness ?  ? ?Past Medical History:  ?Diagnosis Date  ? Atrial fibrillation (HCC)   ? Cochlear implant in place   ? Hypertension   ? Presence of permanent cardiac pacemaker   ? Sick sinus syndrome (HCC)   ? Wears dentures   ? ? ?Patient Active Problem List  ? Diagnosis Date Noted  ? CHF (congestive heart failure) (HCC) 09/04/2015  ? HTN (hypertension) 09/04/2015  ? Atrial fibrillation (HCC) 09/04/2015  ? Dyslipidemia 09/04/2015  ? HOH (hard of hearing) 09/04/2015  ? ? ? ?Physical Exam  ?Triage Vital Signs: ?ED Triage Vitals  ?Enc Vitals Group  ?   BP 07/20/21 0840 (!) 228/67  ?   Pulse Rate 07/20/21 0840 (!) 55  ?   Resp 07/20/21 0840 15  ?   Temp 07/20/21 0840 (!) 97.5 ?F (36.4 ?C)  ?   Temp Source 07/20/21 0840 Oral  ?   SpO2 07/20/21 0840 92 %  ?   Weight 07/20/21 0838 135 lb (61.2 kg)  ?   Height 07/20/21 0838 5\' 5"  (1.651 m)  ?   Head Circumference --   ?   [place] Flow --   ?   Pain Score --   ?   Pain Loc --   ?   Pain Edu? --   ?   Excl. in GC? --   ? ? ?Most recent vital signs: ?Vitals:  ? 07/20/21 1400 07/20/21 1401  ?BP: (!) 246/91 (!) 232/80  ?Pulse: (!) 58   ?Resp:    ?Temp:    ?SpO2:    ? ? ? ?General: Awake, no distress.  ?CV:  Good  peripheral perfusion. No edema ?Resp:  Normal effort.  ?Abd:  No distention.  ?Neuro:             Awake, Alert, Oriented x 3 patient is extremely hard of hearing ?Other: Moves all extremities spontaneously equal strength in lower extremities ?No chest wall tenderness ?No C, T or L-spine tenderness, no ecchymosis on the back or flanks ?No signs of trauma to the head or face ?Abdomen is soft and nontender ?Pelvis is stable, nontender, to range both hips bilaterally without pain ?No focal swelling or deformity of the bilateral upper or lower extremities ?Right knee and right tib-fib area mildly tender to palpation although no swelling or deformity  ? ? ?ED Results / Procedures / Treatments  ?Labs ?(all labs ordered are listed, but only abnormal results are displayed) ?Labs Reviewed  ?CBC WITH DIFFERENTIAL/PLATELET - Abnormal; Notable for the following components:  ?    Result Value  ? HCT 47.0 (*)   ? All other components within normal limits  ?  COMPREHENSIVE METABOLIC PANEL - Abnormal; Notable for the following components:  ? Glucose, Bld 100 (*)   ? GFR, Estimated 53 (*)   ? All other components within normal limits  ?PROTIME-INR - Abnormal; Notable for the following components:  ? Prothrombin Time 20.2 (*)   ? INR 1.7 (*)   ? All other components within normal limits  ?URINALYSIS, ROUTINE W REFLEX MICROSCOPIC - Abnormal; Notable for the following components:  ? Color, Urine STRAW (*)   ? APPearance CLEAR (*)   ? Specific Gravity, Urine 1.004 (*)   ? Hgb urine dipstick SMALL (*)   ? Protein, ur 100 (*)   ? Nitrite POSITIVE (*)   ? Bacteria, UA RARE (*)   ? All other components within normal limits  ?RESP PANEL BY RT-PCR (FLU A&B, COVID) [id number]  ?URINE CULTURE  ? ? ? ?EKG ? ?EKG interpreted by myself, A-fib with bradycardia, borderline left axis no acute ischemic change ? ? ?RADIOLOGY ?I reviewed the CT scan of the brain which does not show any acute intracranial process; agree with radiology report  ? ?I reviewed the  CT of the cervical spine which does not show any acute fracture or misalignment; agree with radiology report  ? ?I reviewed the CXR which does not show any acute cardiopulmonary process; agree with radiology report  ? ? ? ?PROCEDURES: ? ?Critical Care performed: No ? ?Procedures ? ?The patient is on the cardiac monitor to evaluate for evidence of arrhythmia and/or significant heart rate changes. ? ? ?MEDICATIONS ORDERED IN ED: ?Medications  ?isosorbide mononitrate (IMDUR) 24 hr tablet 60 mg (60 mg Oral Given 07/20/21 1400)  ?hydrALAZINE (APRESOLINE) tablet 10 mg (10 mg Oral Given 07/20/21 1400)  ?metoprolol tartrate (LOPRESSOR) tablet 50 mg (50 mg Oral Given 07/20/21 1400)  ?diltiazem (CARDIZEM) tablet 120 mg (120 mg Oral Given 07/20/21 1400)  ?lisinopril (ZESTRIL) tablet 2.5 mg (2.5 mg Oral Given 07/20/21 1401)  ? ? ? ?IMPRESSION / MDM / ASSESSMENT AND PLAN / ED COURSE  ?I reviewed the triage vital signs and the nursing notes. ?             ?               ? ?Differential diagnosis includes, but is not limited to, intracranial hemorrhage, sprain, contusion ? ?Patient is a 88 year old female who lives independently but has significant assistance of her daughter walks with a walker presents after an unwitnessed fall.  Patient rating her belll was found on the ground.  She is on Coumadin.  Patient is hard of hearing so I am having a very difficult time communicating with her but I can get some limited information.  She denies headache back pain hip pain chest pain abdominal pain.  Does have some pain in the right lower extremity but when she is not moving its not hurting her.  Initially she is quite hypertensive but patient was having to pee at this time and after she was able to go blood pressure normalized.  Also somewhat bradycardic.  Overall she appears quite well for her age her really only tenderness on exam is of the right knee and right tib-fib which does not appear deformed or swollen.  She has no C, T or L-spine  tenderness no trauma to the head or chest.  Will obtain a CT C-spine and head given her age and anticoagulation.  Will check INR.  We will also obtain chest x-ray as sats are borderline 90 to 92%  on room air.  Will obtain x-ray of the right hip knee and tib-fib. ? ?CT head C-spine x-rays of the chest hip tib-fib and knee are all unrevealing.  Try to ambulate patient she is fairly unsteady does typically walk with a walker but daughter says that she is more unsteady than normal.  Will send UA and labs.  Quite hypertensive again has not taken any of her home blood pressure meds so we will order. ? ?UA does not show evidence of infection although is nitrite positive there is 0-5 WBCs.  She has no symptoms of UTI although she is having a lot of urine output.  INR is 1.7 CBC BMP reassuring.  Spoke with the hospitalist who agrees to see the patient as a consult but if no indication for admission they would prefer she be seen by PT in the ED.  PT consult ordered. ? ?Per PT patient will need SNF at does not have level of care at her facility that she will need.  Hospitalist Dr. Ophelia CharterYates evaluated the patient but did not feel that she had any medical needs for admission.  We will get social work involved.  ?  ? ? ?FINAL CLINICAL IMPRESSION(S) / ED DIAGNOSES  ? ?Final diagnoses:  ?Fall, initial encounter  ? ? ? ?Rx / DC Orders  ? ?ED Discharge Orders   ? ? None  ? ?  ? ? ? ?Note:  This document was prepared using Dragon voice recognition software and may include unintentional dictation errors. ?  ?Georga HackingMcHugh, Latressa Harries Rose, MD ?07/20/21 1428 ? ?

## 2021-07-20 NOTE — ED Notes (Signed)
Patient moved to hospital bed for comfort.  New warm blankets provided.   ?

## 2021-07-20 NOTE — [place] FL2 (Addendum)
?[place] MEDICAID FL2 LEVEL OF CARE SCREENING TOOL  ?  ? ?IDENTIFICATION  ?Patient Name: ?Brandi Aguirre Birthdate: [birth year] Sex: female Admission Date (Current Location): ?07/20/2021  ?[place] Number: ?  ?  Facility and Address:  ?[facility], [address], [place], [place] [address] ?     Provider Number: ?[id number]  ?Attending Physician Name and Address:  ?Georga Hacking, MD ? Relative Name and Phone Number:  ?Kathe Mariner- DIL- [phone] ?   ?Current Level of Care: ?Other (Comment) (ED boarder waiting for placement) Recommended Level of Care: ?Skilled Nursing Facility Prior Approval Number: ?  ? ?Date Approved/Denied: ?  PASRR Number: ?[id number] A ? ?Discharge Plan: ?SNF ?  ? ?Current Diagnoses: ?Patient Active Problem List  ? Diagnosis Date Noted  ? Generalized weakness 07/20/2021  ? Fall at home, initial encounter 07/20/2021  ? CHF (congestive heart failure) (HCC) 09/04/2015  ? HTN (hypertension) 09/04/2015  ? Atrial fibrillation (HCC) 09/04/2015  ? Dyslipidemia 09/04/2015  ? HOH (hard of hearing) 09/04/2015  ? ? ?Orientation RESPIRATION BLADDER Height & Weight   ?  ?Self, Time, Situation, Place ? Normal Incontinent Weight: 61.2 kg ?Height:  5\' 5"  (165.1 cm)  ?BEHAVIORAL SYMPTOMS/MOOD NEUROLOGICAL BOWEL NUTRITION STATUS  ?    Incontinent Diet (Regular)  ?AMBULATORY STATUS COMMUNICATION OF NEEDS Skin   ?Limited Assist Verbally Normal ?  ?  ?  ?    ?     ?     ? ? ?Personal Care Assistance Level of Assistance  ?Bathing, Feeding, Dressing Bathing Assistance: Maximum assistance ?Feeding assistance: Limited assistance ?Dressing Assistance: Maximum assistance ?   ? ?Functional Limitations Info  ?Sight, Hearing, Speech Sight Info: Impaired ?Hearing Info: Impaired ?Speech Info: Adequate  ? ? ?SPECIAL CARE FACTORS FREQUENCY  ?PT (By licensed PT), OT (By licensed OT)   ?  ?PT Frequency: 5 times per week ?OT Frequency: 5 times per week ?  ?  ?  ?    ? ? ?Contractures Contractures Info: Not present  ? ? ?Additional Factors Info  ?Code Status, Allergies Code Status Info: Full ?Allergies Info: Sulfa Antibiotics ?  ?  ?  ?   ? ?Current Medications (07/20/2021):  This is the current hospital active medication list ?Current Facility-Administered Medications  ?Medication Dose Route Frequency Provider Last Rate Last Admin  ? isosorbide mononitrate (IMDUR) 24 hr tablet 60 mg  60 mg Oral Daily 07/22/2021, MD   60 mg at 07/20/21 1400  ? ?Current Outpatient Medications  ?Medication Sig Dispense Refill  ? diltiazem (CARDIZEM) 120 MG tablet Take 120 mg by mouth 2 (two) times daily.  1  ? hydrALAZINE (APRESOLINE) 10 MG tablet Take 1 tablet (10 mg total) by mouth 3 (three) times daily. 90 tablet 0  ? isosorbide mononitrate (IMDUR) 60 MG 24 hr tablet Take 60 mg by mouth daily.   0  ? lisinopril (PRINIVIL,ZESTRIL) 2.5 MG tablet Take 1 tablet (2.5 mg total) by mouth daily. 30 tablet 0  ? metoprolol (LOPRESSOR) 50 MG tablet Take 1 tablet (50 mg total) by mouth 2 (two) times daily. 60 tablet 0  ? traZODone (DESYREL) 100 MG tablet Take 100 mg by mouth at bedtime.   0  ? warfarin (COUMADIN) 6 MG tablet Take 1 tablet (6 mg total) by mouth daily at 6 PM. (Patient taking differently: Take 5 mg by mouth daily at 6 PM.) 30 tablet 0  ? erythromycin ophthalmic ointment Apply to sutures 4 times a day for  10-12 days.  Discontinue if allergy develops and call our office (Patient not taking: Reported on 07/20/2021) 3.5 g 2  ? furosemide (LASIX) 20 MG tablet Take 1 tablet (20 mg total) by mouth daily. (Patient not taking: Reported on 07/20/2021) 30 tablet 0  ? warfarin (COUMADIN) 5 MG tablet Take 5 mg by mouth daily.    ? ? ? ?Discharge Medications: ?Please see discharge summary for a list of discharge medications. ? ?Relevant Imaging Results: ? ?Relevant Lab Results: ? ? ?Additional Information ?SS#- [id number] ? ?Allayne Butcher, RN ? ? ? ? ?

## 2021-07-20 NOTE — ED Notes (Signed)
Patient noted to have decreasing saturations while sleeping.  Placed on 2L [place] for comfort and to maintain saturations while sleeping. ?

## 2021-07-20 NOTE — ED Notes (Signed)
MD aware of Bp Pt states she has not taken her meds today.  ?

## 2021-07-20 NOTE — ED Notes (Signed)
Pt cleaned and changed, purewick replaced and pt placed in a new brief with pad underneath.  ?

## 2021-07-20 NOTE — ED Notes (Signed)
See triage note. Pt had a unwitnessed mechanical fall this morning. Pt lives in assisted living and hit her alert button for staff assist. Pt normally walks with walker. Pt able to assist in standing to bed with 2 person staff assist. Pt on blood thinners. Pt hard of hearing and makes examination difficult.  ?

## 2021-07-20 NOTE — TOC Initial Note (Signed)
Transition of Care (TOC) - Initial/Assessment Note  ? ? ?Patient Details  ?Name: Brandi Aguirre ?MRN: [id number] ?Date of Birth: 1921-10-21 ? ?Transition of Care (TOC) CM/SW Contact:    ?Allayne Butcher, RN ?Phone Number: ?07/20/2021, 4:37 PM ? ?Clinical Narrative:                 ?Patient came in from Independent living at [facility].  Patient normally walks with a walker and she was found this morning on the floor.  Patient does not need medical admission but PT evaluated and feels patient appropriate for SNF.  RNCM has left a message for return call with daughter in law.  Will start bed search for SNF.  [facility] is close to [facility] and they are part of the same company.   ? ?Expected Discharge Plan: Skilled Nursing Facility ?Barriers to Discharge: SNF Pending bed offer ? ? ?Patient Goals and CMS Choice ?  ?CMS Medicare.gov Compare Post Acute Care list provided to:: Patient ?Choice offered to / list presented to : Patient, Adult Children ? ?Expected Discharge Plan and Services ?Expected Discharge Plan: Skilled Nursing Facility ?  ?Discharge Planning Services: CM Consult ?Post Acute Care Choice: Skilled Nursing Facility ?Living arrangements for the past 2 months: Independent Living Facility ?                ?DME Arranged: N/A ?DME Agency: NA ?  ?  ?  ?HH Arranged: NA ?HH Agency: NA ?  ?  ?  ? ?Prior Living Arrangements/Services ?Living arrangements for the past 2 months: Independent Living Facility ?Lives with:: Self ?Patient language and need for interpreter reviewed:: Yes ?Do you feel safe going back to the place where you live?: Yes      ?Need for Family Participation in Patient Care: Yes (Comment) ?Care giver support system in place?: Yes (comment) ?Current home services: DME (walker) ?Criminal Activity/Legal Involvement Pertinent to Current Situation/Hospitalization: No - Comment as needed ? ?Activities of Daily Living ?  ?  ? ?Permission Sought/Granted ?Permission sought to share information with : Case  Manager, Family Supports, Magazine features editor ?Permission granted to share information with : Yes, Verbal Permission Granted ? Share Information with NAME: Kathe Mariner ? Permission granted to share info w AGENCY: SNF's ? Permission granted to share info w Relationship: daughter in law ? Permission granted to share info w Contact Information: [phone] ? ?Emotional Assessment ?Appearance:: Appears stated age ?  ?  ?Orientation: : Oriented to Self, Oriented to Place, Oriented to  Time, Oriented to Situation ?Alcohol / Substance Use: Not Applicable ?Psych Involvement: No (comment) ? ?Admission diagnosis:  Fall, EMS ?Patient Active Problem List  ? Diagnosis Date Noted  ? Generalized weakness 07/20/2021  ? Fall at home, initial encounter 07/20/2021  ? CHF (congestive heart failure) (HCC) 09/04/2015  ? HTN (hypertension) 09/04/2015  ? Atrial fibrillation (HCC) 09/04/2015  ? Dyslipidemia 09/04/2015  ? HOH (hard of hearing) 09/04/2015  ? ?PCP:  Dione Housekeeper, MD ?Pharmacy:   ?[id number] - [place], [place] - [address] AT [facility] [address] ?[address] ?[place] [address] ?Phone: [phone] Fax: [phone] ? ? ? ? ?Social Determinants of Health (SDOH) Interventions ?  ? ?Readmission Risk Interventions ?   ? View : No data to display.  ?  ?  ?  ? ? ? ?

## 2021-07-20 NOTE — ED Notes (Signed)
Patient updated on plan of care

## 2021-07-20 NOTE — ED Notes (Signed)
Pt given meal tray, ate 25% ?

## 2021-07-20 NOTE — Progress Notes (Addendum)
ANTICOAGULATION CONSULT NOTE ? ?Pharmacy Consult for warfarin ?Indication: atrial fibrillation ? ?Allergies  ?Allergen Reactions  ? Sulfa Antibiotics Rash  ? ? ?Patient Measurements: ?Height: 5\' 5"  (165.1 cm) ?Weight: 61.2 kg (135 lb) ?IBW/kg (Calculated) : 57 ? ?Vital Signs: ?Temp: 97.5 ?F (36.4 ?C) (04/24 0840) ?Temp Source: Oral (04/24 0840) ?BP: 183/94 (04/24 1500) ?Pulse Rate: 54 (04/24 1500) ? ?Labs: ?Recent Labs  ?  07/20/21 ?1204 07/20/21 ?1209  ?HGB  --  14.8  ?HCT  --  47.0*  ?PLT  --  174  ?LABPROT 20.2*  --   ?INR 1.7*  --   ?CREATININE  --  0.97  ? ? ?Estimated Creatinine Clearance: 29.1 mL/min (by C-G formula based on SCr of 0.97 mg/dL). ? ? ?Medical History: ?Past Medical History:  ?Diagnosis Date  ? Atrial fibrillation ([place])   ? Cochlear implant in place   ? Hypertension   ? Presence of permanent cardiac pacemaker   ? Sick sinus syndrome ([place])   ? Wears dentures   ? ? ?Medications:  ?PTA warfarin regimen: 5 mg on Tue/Fri; 3 mg on all other days. Last dose was 5 mg on 4/23 at 1800.  ? ?Assessment: ?88 y.o. female past medical history of atrial fibrillation, sick sinus syndrome with pacemaker status post cochlear implant, hypertension who presented to the ED after a fall. Pharmacy consulted for warfarin.  ? ?Goal of Therapy:  ?INR 2-3 ?Monitor platelets by anticoagulation protocol: Yes ?  ?Plan:  ?INR subtherapeutic. Will give warfarin 5 mg tonight.  ?Check INR daily ?CBC per protocol ? ?Brandi Aguirre ?07/20/2021,4:59 PM ? ? ?

## 2021-07-20 NOTE — ED Notes (Signed)
Pt fall alarm set  ? ?

## 2021-07-20 NOTE — ED Notes (Signed)
New adult depend placed and external catheter readjusted.  ?

## 2021-07-20 NOTE — ED Notes (Signed)
Lights in room dimmed for comfort 

## 2021-07-20 NOTE — ED Notes (Signed)
Physical therapy at bedside

## 2021-07-21 DIAGNOSIS — M25561 Pain in right knee: Secondary | ICD-10-CM | POA: Diagnosis not present

## 2021-07-21 LAB — URINE CULTURE

## 2021-07-21 LAB — PROTIME-INR
INR: 2 — ABNORMAL HIGH (ref 0.8–1.2)
Prothrombin Time: 22.4 seconds — ABNORMAL HIGH (ref 11.4–15.2)

## 2021-07-21 MED ORDER — WARFARIN SODIUM 5 MG PO TABS
5.0000 mg | ORAL_TABLET | Freq: Once | ORAL | Status: DC
  Filled 2021-07-21: qty 1

## 2021-07-21 NOTE — ED Notes (Signed)
[facility]  CALLED  FOR  TRANSPORT  TO  [facility] 

## 2021-07-21 NOTE — TOC Transition Note (Signed)
Transition of Care (TOC) - CM/SW Discharge Note ? ? ?Patient Details  ?Name: Brandi Aguirre ?MRN: [id number] ?Date of Birth: 03-10-1922 ? ?Transition of Care (TOC) CM/SW Contact:  ?Allayne Butcher, RN ?Phone Number: ?07/21/2021, 11:51 AM ? ? ?Clinical Narrative:    ?Patient has been accepted by [facility], and [facility] - bed offers presented to patient and daughter in law, Brandi Aguirre.  They do not have a preference but she has been to [facility] before- they are okay with her going back there.  Patient will be going to room 301- bedside RN will call report to [phone].  ED secretary will set up EMS transport.  ? ? ?Final next level of care: Skilled Nursing Facility ?Barriers to Discharge: Barriers Resolved ? ? ?Patient Goals and CMS Choice ?Patient states their goals for this hospitalization and ongoing recovery are:: patient agrees to SNF for short term rehab ?CMS Medicare.gov Compare Post Acute Care list provided to:: Patient ?Choice offered to / list presented to : Patient, Adult Children ? ?Discharge Placement ?  ?Existing PASRR number confirmed : 07/20/21          ?Patient chooses bed at: [facility] ?Patient to be transferred to facility by: [facility] ?Name of family member notified: Kathe Mariner- DIL ?Patient and family notified of of transfer: 07/21/21 ? ?Discharge Plan and Services ?  ?Discharge Planning Services: CM Consult ?Post Acute Care Choice: Skilled Nursing Facility          ?DME Arranged: N/A ?DME Agency: NA ?  ?  ?  ?HH Arranged: NA ?HH Agency: NA ?  ?  ?  ? ?Social Determinants of Health (SDOH) Interventions ?  ? ? ?Readmission Risk Interventions ?   ? View : No data to display.  ?  ?  ?  ? ? ? ? ? ?

## 2021-07-21 NOTE — ED Notes (Signed)
Patient resting quietly.  Respirations even and unlabored.  ?

## 2021-07-21 NOTE — Progress Notes (Signed)
ANTICOAGULATION CONSULT NOTE ? ?Pharmacy Consult for warfarin ?Indication: atrial fibrillation ? ?Allergies  ?Allergen Reactions  ? Sulfa Antibiotics Rash  ? ? ?Patient Measurements: ?Height: 5\' 5"  (165.1 cm) ?Weight: 61.2 kg (135 lb) ?IBW/kg (Calculated) : 57 ? ?Vital Signs: ?BP: 145/86 (04/25 0947) ?Pulse Rate: 64 (04/25 0947) ? ?Labs: ?Recent Labs  ?  07/20/21 ?1204 07/20/21 ?1209 07/21/21 ?1136  ?HGB  --  14.8  --   ?HCT  --  47.0*  --   ?PLT  --  174  --   ?LABPROT 20.2*  --  22.4*  ?INR 1.7*  --  2.0*  ?CREATININE  --  0.97  --   ? ? ? ?Estimated Creatinine Clearance: 29.1 mL/min (by C-G formula based on SCr of 0.97 mg/dL). ? ? ?Medical History: ?Past Medical History:  ?Diagnosis Date  ? Atrial fibrillation (HCC)   ? Cochlear implant in place   ? Hypertension   ? Presence of permanent cardiac pacemaker   ? Sick sinus syndrome (HCC)   ? Wears dentures   ? ? ?Medications:  ?PTA warfarin regimen: 5 mg on Tue/Fri; 3 mg on all other days. Last dose was 5 mg on 4/23 at 1800.  ? ?Assessment: ?88 y.o. female past medical history of atrial fibrillation, sick sinus syndrome with pacemaker status post cochlear implant, hypertension who presented to the ED after a fall. Pharmacy consulted for warfarin.  ? ?Date INR Warfarin Dose  ?4/24 1.7 5 mg  ?4/25 2.0 5 mg  ? ?Goal of Therapy:  ?INR 2-3 ?Monitor platelets by anticoagulation protocol: Yes ?  ?Plan:  ?INR therapeutic. Will resume home regimen and given warfarin 5 mg tonight.  ?Check INR daily ?CBC per protocol ? ?5/25, PharmD, BCPS ?Clinical Pharmacist   ?07/21/2021,12:51 PM ? ? ?

## 2021-07-21 NOTE — ED Notes (Signed)
Patient assisted to walk to  commode and use bathroom.  Patient able to ambulate with assistance and walker.  Patient returned to chair to "sit up for a bit."  Patient given call bell and instructed to call RN when she wants to get up ?

## 2021-07-21 NOTE — ED Provider Notes (Signed)
TOC has found a room for the patient at [facility].  No barriers to outpatient management.  We will discharge via EMS to facility.  No acute events this morning on my shift. ?  ?Delton Prairie, MD ?07/21/21 1204 ? ?

## 2021-08-19 ENCOUNTER — Inpatient Hospital Stay
Admit: 2021-08-19 | Discharge: 2021-08-19 | Disposition: A | Discharge status: Home / Self Care | Payer: Medicare Other | Attending: Internal Medicine | Admitting: Internal Medicine

## 2021-08-19 ENCOUNTER — Inpatient Hospital Stay
Admission: EM | Admit: 2021-08-19 | Discharge: 2021-08-25 | DRG: 291 | Disposition: A | Discharge status: Hospice | Payer: Medicare Other | Source: Skilled Nursing Facility | Attending: Internal Medicine | Admitting: Internal Medicine

## 2021-08-19 ENCOUNTER — Emergency Department: Payer: Medicare Other

## 2021-08-19 ENCOUNTER — Other Ambulatory Visit: Payer: Self-pay

## 2021-08-19 DIAGNOSIS — I110 Hypertensive heart disease with heart failure: Secondary | ICD-10-CM | POA: Diagnosis present

## 2021-08-19 DIAGNOSIS — R778 Other specified abnormalities of plasma proteins: Secondary | ICD-10-CM

## 2021-08-19 DIAGNOSIS — I5023 Acute on chronic systolic (congestive) heart failure: Secondary | ICD-10-CM | POA: Diagnosis present

## 2021-08-19 DIAGNOSIS — I480 Paroxysmal atrial fibrillation: Secondary | ICD-10-CM

## 2021-08-19 DIAGNOSIS — H9190 Unspecified hearing loss, unspecified ear: Secondary | ICD-10-CM | POA: Diagnosis present

## 2021-08-19 DIAGNOSIS — Z79899 Other long term (current) drug therapy: Secondary | ICD-10-CM | POA: Diagnosis not present

## 2021-08-19 DIAGNOSIS — I248 Other forms of acute ischemic heart disease: Secondary | ICD-10-CM | POA: Diagnosis present

## 2021-08-19 DIAGNOSIS — Z66 Do not resuscitate: Secondary | ICD-10-CM | POA: Diagnosis present

## 2021-08-19 DIAGNOSIS — E785 Hyperlipidemia, unspecified: Secondary | ICD-10-CM | POA: Diagnosis present

## 2021-08-19 DIAGNOSIS — R54 Age-related physical debility: Secondary | ICD-10-CM | POA: Diagnosis present

## 2021-08-19 DIAGNOSIS — Z950 Presence of cardiac pacemaker: Secondary | ICD-10-CM | POA: Diagnosis not present

## 2021-08-19 DIAGNOSIS — H9193 Unspecified hearing loss, bilateral: Secondary | ICD-10-CM | POA: Diagnosis not present

## 2021-08-19 DIAGNOSIS — E43 Unspecified severe protein-calorie malnutrition: Secondary | ICD-10-CM | POA: Diagnosis present

## 2021-08-19 DIAGNOSIS — Z7901 Long term (current) use of anticoagulants: Secondary | ICD-10-CM | POA: Diagnosis not present

## 2021-08-19 DIAGNOSIS — N179 Acute kidney failure, unspecified: Secondary | ICD-10-CM | POA: Diagnosis present

## 2021-08-19 DIAGNOSIS — Z515 Encounter for palliative care: Secondary | ICD-10-CM | POA: Diagnosis not present

## 2021-08-19 DIAGNOSIS — I509 Heart failure, unspecified: Secondary | ICD-10-CM | POA: Diagnosis present

## 2021-08-19 DIAGNOSIS — I495 Sick sinus syndrome: Secondary | ICD-10-CM | POA: Diagnosis present

## 2021-08-19 DIAGNOSIS — Z882 Allergy status to sulfonamides status: Secondary | ICD-10-CM | POA: Diagnosis not present

## 2021-08-19 DIAGNOSIS — Z7189 Other specified counseling: Secondary | ICD-10-CM | POA: Diagnosis not present

## 2021-08-19 DIAGNOSIS — R7989 Other specified abnormal findings of blood chemistry: Secondary | ICD-10-CM

## 2021-08-19 DIAGNOSIS — Z87891 Personal history of nicotine dependence: Secondary | ICD-10-CM

## 2021-08-19 DIAGNOSIS — I472 Ventricular tachycardia, unspecified: Secondary | ICD-10-CM | POA: Diagnosis present

## 2021-08-19 DIAGNOSIS — Z681 Body mass index (BMI) 19 or less, adult: Secondary | ICD-10-CM

## 2021-08-19 DIAGNOSIS — J9601 Acute respiratory failure with hypoxia: Secondary | ICD-10-CM | POA: Diagnosis present

## 2021-08-19 DIAGNOSIS — D72829 Elevated white blood cell count, unspecified: Secondary | ICD-10-CM | POA: Diagnosis present

## 2021-08-19 DIAGNOSIS — I10 Essential (primary) hypertension: Secondary | ICD-10-CM | POA: Diagnosis not present

## 2021-08-19 LAB — COMPREHENSIVE METABOLIC PANEL
ALT: 10 U/L (ref 0–44)
AST: 19 U/L (ref 15–41)
Albumin: 3.5 g/dL (ref 3.5–5.0)
Alkaline Phosphatase: 68 U/L (ref 38–126)
Anion gap: 9 (ref 5–15)
BUN: 16 mg/dL (ref 8–23)
CO2: 27 mmol/L (ref 22–32)
Calcium: 8.8 mg/dL — ABNORMAL LOW (ref 8.9–10.3)
Chloride: 104 mmol/L (ref 98–111)
Creatinine, Ser: 0.95 mg/dL (ref 0.44–1.00)
GFR, Estimated: 54 mL/min — ABNORMAL LOW (ref >60)
Glucose, Bld: 140 mg/dL — ABNORMAL HIGH (ref 70–99)
Potassium: 4.1 mmol/L (ref 3.5–5.1)
Sodium: 140 mmol/L (ref 135–145)
Total Bilirubin: 0.8 mg/dL (ref 0.3–1.2)
Total Protein: 6.7 g/dL (ref 6.5–8.1)

## 2021-08-19 LAB — CBC
HCT: 43.3 % (ref 36.0–46.0)
Hemoglobin: 13.6 g/dL (ref 12.0–15.0)
MCH: 29.9 pg (ref 26.0–34.0)
MCHC: 31.4 g/dL (ref 30.0–36.0)
MCV: 95.2 fL (ref 80.0–100.0)
Platelets: 136 10*3/uL — ABNORMAL LOW (ref 150–400)
RBC: 4.55 MIL/uL (ref 3.87–5.11)
RDW: 13.7 % (ref 11.5–15.5)
WBC: 12.4 10*3/uL — ABNORMAL HIGH (ref 4.0–10.5)
nRBC: 0 % (ref 0.0–0.2)

## 2021-08-19 LAB — BRAIN NATRIURETIC PEPTIDE: B Natriuretic Peptide: 646.9 pg/mL — ABNORMAL HIGH (ref 0.0–100.0)

## 2021-08-19 LAB — PROTIME-INR
INR: 2.1 — ABNORMAL HIGH (ref 0.8–1.2)
Prothrombin Time: 23.5 seconds — ABNORMAL HIGH (ref 11.4–15.2)

## 2021-08-19 LAB — ECHOCARDIOGRAM COMPLETE
AR max vel: 1.57 cm2
AV Area VTI: 1.49 cm2
AV Area mean vel: 1.48 cm2
AV Mean grad: 4 mmHg
AV Peak grad: 7.2 mmHg
Ao pk vel: 1.34 m/s
Area-P 1/2: 3.36 cm2
MV VTI: 1.07 cm2
S' Lateral: 2.68 cm
Weight: 2151.69 oz

## 2021-08-19 LAB — TROPONIN I (HIGH SENSITIVITY)
Troponin I (High Sensitivity): 99 ng/L — ABNORMAL HIGH (ref <18)
Troponin I (High Sensitivity): 914 ng/L (ref <18)

## 2021-08-19 MED ORDER — NITROGLYCERIN 0.4 MG SL SUBL
0.4000 mg | SUBLINGUAL_TABLET | SUBLINGUAL | Status: DC | PRN
Start: 2021-08-19 — End: 2021-08-26
  Administered 2021-08-19: 0.4 mg via SUBLINGUAL
  Filled 2021-08-19 (×2): qty 1

## 2021-08-19 MED ORDER — WARFARIN SODIUM 5 MG PO TABS
5.0000 mg | ORAL_TABLET | ORAL | Status: DC
  Administered 2021-08-20: 5 mg via ORAL
  Filled 2021-08-19: qty 1

## 2021-08-19 MED ORDER — METOPROLOL SUCCINATE ER 25 MG PO TB24
12.5000 mg | ORAL_TABLET | Freq: Every day | ORAL | Status: DC
  Administered 2021-08-19 – 2021-08-25 (×7): 12.5 mg via ORAL
  Filled 2021-08-19: qty 0.5
  Filled 2021-08-19 (×7): qty 1

## 2021-08-19 MED ORDER — ISOSORBIDE MONONITRATE ER 60 MG PO TB24
60.0000 mg | ORAL_TABLET | Freq: Every day | ORAL | Status: DC
  Administered 2021-08-19: 60 mg via ORAL
  Filled 2021-08-19: qty 1

## 2021-08-19 MED ORDER — ONDANSETRON HCL 4 MG/2ML IJ SOLN: 4.0000 mg | Freq: Four times a day (QID) | INTRAMUSCULAR | Status: DC | PRN

## 2021-08-19 MED ORDER — SODIUM CHLORIDE 0.9% FLUSH
3.0000 mL | Freq: Two times a day (BID) | INTRAVENOUS | Status: DC
  Administered 2021-08-19 – 2021-08-25 (×12): 3 mL via INTRAVENOUS

## 2021-08-19 MED ORDER — ASPIRIN 325 MG PO TBEC
325.0000 mg | DELAYED_RELEASE_TABLET | Freq: Every day | ORAL | Status: DC
  Administered 2021-08-19: 325 mg via ORAL
  Filled 2021-08-19: qty 1

## 2021-08-19 MED ORDER — LISINOPRIL 5 MG PO TABS
2.5000 mg | ORAL_TABLET | Freq: Every day | ORAL | Status: DC
  Administered 2021-08-19: 2.5 mg via ORAL
  Filled 2021-08-19: qty 1

## 2021-08-19 MED ORDER — HYDRALAZINE HCL 10 MG PO TABS
10.0000 mg | ORAL_TABLET | Freq: Three times a day (TID) | ORAL | Status: DC
Start: 2021-08-19 — End: 2021-08-22
  Administered 2021-08-19 – 2021-08-21 (×9): 10 mg via ORAL
  Filled 2021-08-19 (×10): qty 1

## 2021-08-19 MED ORDER — WARFARIN - PHARMACIST DOSING INPATIENT
Freq: Every day | Status: DC
  Filled 2021-08-19: qty 1

## 2021-08-19 MED ORDER — SODIUM CHLORIDE 0.9% FLUSH: 3.0000 mL | INTRAVENOUS | Status: DC | PRN

## 2021-08-19 MED ORDER — SODIUM CHLORIDE 0.9 % IV SOLN: 250.0000 mL | INTRAVENOUS | Status: DC | PRN

## 2021-08-19 MED ORDER — FUROSEMIDE 10 MG/ML IJ SOLN: 40.0000 mg | Freq: Every day | INTRAMUSCULAR | Status: DC

## 2021-08-19 MED ORDER — FUROSEMIDE 10 MG/ML IJ SOLN
40.0000 mg | Freq: Once | INTRAMUSCULAR | Status: AC
  Administered 2021-08-19: 40 mg via INTRAVENOUS
  Filled 2021-08-19: qty 4

## 2021-08-19 MED ORDER — TRAZODONE HCL 100 MG PO TABS
100.0000 mg | ORAL_TABLET | Freq: Every day | ORAL | Status: DC
  Administered 2021-08-19 – 2021-08-24 (×6): 100 mg via ORAL
  Filled 2021-08-19 (×6): qty 1

## 2021-08-19 MED ORDER — FUROSEMIDE 10 MG/ML IJ SOLN
20.0000 mg | Freq: Every day | INTRAMUSCULAR | Status: DC
  Administered 2021-08-20 – 2021-08-22 (×3): 20 mg via INTRAVENOUS
  Filled 2021-08-19 (×4): qty 2

## 2021-08-19 MED ORDER — ACETAMINOPHEN 325 MG PO TABS: 650.0000 mg | ORAL_TABLET | ORAL | Status: DC | PRN

## 2021-08-19 MED ORDER — ASPIRIN 81 MG PO CHEW: 81.0000 mg | CHEWABLE_TABLET | Freq: Every day | ORAL | Status: DC

## 2021-08-19 MED ORDER — WARFARIN SODIUM 3 MG PO TABS
3.0000 mg | ORAL_TABLET | ORAL | Status: DC
  Administered 2021-08-19 – 2021-08-22 (×3): 3 mg via ORAL
  Filled 2021-08-19 (×3): qty 1

## 2021-08-19 NOTE — Progress Notes (Signed)
Admission profile updated. ?

## 2021-08-19 NOTE — H&P (Signed)
History and Physical    Clotine Heiner UMP:[id number] DOB: Dec 28, 1921 DOA: 08/19/2021  PCP: Dione Housekeeper, MD  Patient coming from: home   Chief Complaint: sob  HPI: Alaine Loughney is a 88 y.o. female with medical history significant of history of A-fib on coumadin, hypertension , hard of hearing, PPM placement was brought by EMS after she called her daughter in law this am stating she is sob and cold. Pt has very bad hard of hearing and is unable to communicate with me.  Tried writing things down but still did not work as far as responding to my questions.  Daughter-in-law gave me the history.  She had denied any chest pain, fever, dizziness.  She does not wear oxygen and she was found to be 87% on room air by EMS. Daughter-in-law last saw patient 2 to 3 days ago and patient was doing fine without any breathing issues.  Of note she was seen last month in the ED for fall.    ED Course: Blood pressure 161/58, on 94% on 3 L, respiratory rate 30, pulse 44, afebrile with temperature of 97.5.  Troponin elevated 914, BNP 646, WBC 12.4, glucose 140, sodium 140, creatinine 0.95, calcium 8.8, CO2 27, potassium 4.1, INR 2.1 EKG personally reviewed sinus rhythm without acute ischemic ST changes.  Chest x-ray personally reviewed with pulmonary vascular congestion with new interstitial infiltrate?  Pulmonary edema and aortic atherosclerosis. In the ED patient was given Lasix 40 mg IV x1, nitroglycerin sublingual x1  ED had short run of VT, frequent pvc.    When I spoke to daughter-in-law after discussing patient's current situation and further management, it was thought best to consult palliative care, possibly even hospice.  Review of Systems: All systems reviewed and otherwise negative.    Past Medical History:  Diagnosis Date   Atrial fibrillation [facility])    Cochlear implant in place    Hypertension    Presence of permanent cardiac pacemaker    Sick sinus syndrome (HCC)    Wears  dentures     Past Surgical History:  Procedure Laterality Date   ABDOMINAL HYSTERECTOMY     COCHLEAR IMPLANT     ECTROPION REPAIR Right 01/16/2021   Procedure: ECTROPION REPAIR, TARSAL WEDGE ECTROPION REPAIR, EXTENSIVE RIGHT;  Surgeon: Imagene Riches, MD;  Location: [facility];  Service: Ophthalmology;  Laterality: Right;   PACEMAKER INSERTION       reports that she has quit smoking. Her smoking use included cigarettes. She has never used smokeless tobacco. She reports that she does not currently use alcohol. No history on file for drug use.  Allergies  Allergen Reactions   Sulfa Antibiotics Rash    History reviewed. No pertinent family history.   Prior to Admission medications   Medication Sig Start Date End Date Taking? Authorizing Provider  diltiazem (CARDIZEM) 120 MG tablet Take 120 mg by mouth 2 (two) times daily. 08/04/15   [provider]  erythromycin ophthalmic ointment Apply to sutures 4 times a day for 10-12 days.  Discontinue if allergy develops and call our office Patient not taking: Reported on 07/20/2021 01/16/21   Imagene Riches, MD  furosemide (LASIX) 20 MG tablet Take 1 tablet (20 mg total) by mouth daily. Patient not taking: Reported on 07/20/2021 09/07/15   Alford Highland, MD  hydrALAZINE (APRESOLINE) 10 MG tablet Take 1 tablet (10 mg total) by mouth 3 (three) times daily. 09/07/15   Alford Highland, MD  isosorbide mononitrate (IMDUR) 60 MG 24  hr tablet Take 60 mg by mouth daily.  08/05/15   [provider]  lisinopril (PRINIVIL,ZESTRIL) 2.5 MG tablet Take 1 tablet (2.5 mg total) by mouth daily. 09/07/15   Alford Highland, MD  metoprolol (LOPRESSOR) 50 MG tablet Take 1 tablet (50 mg total) by mouth 2 (two) times daily. 09/07/15   Alford Highland, MD  traZODone (DESYREL) 100 MG tablet Take 100 mg by mouth at bedtime.  08/04/15   [provider]  warfarin (COUMADIN) 5 MG tablet Take 5 mg by mouth daily. 06/04/21   [provider]   warfarin (COUMADIN) 6 MG tablet Take 1 tablet (6 mg total) by mouth daily at 6 PM. Patient taking differently: Take 5 mg by mouth daily at 6 PM. 09/08/15   Alford Highland, MD    Physical Exam: Vitals:   08/19/21 0900 08/19/21 1030 08/19/21 1330 08/19/21 1345  BP: (!) 161/58 (!) 169/71 (!) 107/46   Pulse: (!) 44 81 (!) 102 83  Resp: (!) 30 (!) 22 (!) 25 (!) 21  Temp:      TempSrc:      SpO2: 94% 93% 94% 95%  Weight:        Constitutional: NAD, calm, comfortable Vitals:   08/19/21 0900 08/19/21 1030 08/19/21 1330 08/19/21 1345  BP: (!) 161/58 (!) 169/71 (!) 107/46   Pulse: (!) 44 81 (!) 102 83  Resp: (!) 30 (!) 22 (!) 25 (!) 21  Temp:      TempSrc:      SpO2: 94% 93% 94% 95%  Weight:       Eyes: EOMI lids and conjunctivae normal ENMT: Mucous membranes are moist.  Neck: normal, supple Respiratory: +fine crackles b/l, no wheezing. No rhonchi Cardiovascular: Regular rate and rhythm, no murmurs / rubs / gallops. No extremity edema. 2+ pedal pulses. No carotid bruits.  Abdomen: no tenderness, no masses palpated. No hepatosplenomegaly. Bowel sounds positive.  Musculoskeletal: no clubbing / cyanosis.  Normal muscle tone. Good strength Skin: warm, dry Neurologic: CN 2-12 grossly intact. Sensation intact Strength 5/5 in all 4.  Psychiatric: Normal judgment and insight. Alert and awake. Normal mood for current setting   Labs on Admission: I have personally reviewed following labs and imaging studies  CBC: Recent Labs  Lab 08/19/21 0813  WBC 12.4*  HGB 13.6  HCT 43.3  MCV 95.2  PLT 136*   Basic Metabolic Panel: Recent Labs  Lab 08/19/21 0813  NA 140  K 4.1  CL 104  CO2 27  GLUCOSE 140*  BUN 16  CREATININE 0.95  CALCIUM 8.8*   GFR: Estimated Creatinine Clearance: 29.8 mL/min (by C-G formula based on SCr of 0.95 mg/dL). Liver Function Tests: Recent Labs  Lab 08/19/21 0813  AST 19  ALT 10  ALKPHOS 68  BILITOT 0.8  PROT 6.7  ALBUMIN 3.5   No results for  input(s): LIPASE, AMYLASE in the last 168 hours. No results for input(s): AMMONIA in the last 168 hours. Coagulation Profile: Recent Labs  Lab 08/19/21 0813  INR 2.1*   Cardiac Enzymes: No results for input(s): CKTOTAL, CKMB, CKMBINDEX, TROPONINI in the last 168 hours. BNP (last 3 results) No results for input(s): PROBNP in the last 8760 hours. HbA1C: No results for input(s): HGBA1C in the last 72 hours. CBG: No results for input(s): GLUCAP in the last 168 hours. Lipid Profile: No results for input(s): CHOL, HDL, LDLCALC, TRIG, CHOLHDL, LDLDIRECT in the last 72 hours. Thyroid Function Tests: No results for input(s): TSH, T4TOTAL, FREET4,  T3FREE, THYROIDAB in the last 72 hours. Anemia Panel: No results for input(s): VITAMINB12, FOLATE, FERRITIN, TIBC, IRON, RETICCTPCT in the last 72 hours. Urine analysis:    Component Value Date/Time   COLORURINE STRAW (A) 07/20/2021 1209   APPEARANCEUR CLEAR (A) 07/20/2021 1209   APPEARANCEUR Hazy 05/06/2013 1923   LABSPEC 1.004 (L) 07/20/2021 1209   LABSPEC 1.012 05/06/2013 1923   PHURINE 8.0 07/20/2021 1209   GLUCOSEU NEGATIVE 07/20/2021 1209   GLUCOSEU Negative 05/06/2013 1923   HGBUR SMALL (A) 07/20/2021 1209   BILIRUBINUR NEGATIVE 07/20/2021 1209   BILIRUBINUR Negative 05/06/2013 1923   KETONESUR NEGATIVE 07/20/2021 1209   PROTEINUR 100 (A) 07/20/2021 1209   NITRITE POSITIVE (A) 07/20/2021 1209   LEUKOCYTESUR NEGATIVE 07/20/2021 1209   LEUKOCYTESUR Trace 05/06/2013 1923    Radiological Exams on Admission: DG Chest Portable 1 View  Result Date: 08/19/2021 CLINICAL DATA:  Shortness of breath, hypoxemia, cough, atrial fibrillation EXAM: PORTABLE CHEST 1 VIEW COMPARISON:  Portable exam 0820 hours compared to 07/20/2021 FINDINGS: LEFT subclavian pacemaker leads project over RIGHT atrium and RIGHT ventricle. Upper normal heart size with pulmonary vascular congestion. Atherosclerotic calcification aorta. Scattered interstitial infiltrates  bilaterally, favor pulmonary edema. No pleural effusion or pneumothorax. Bones demineralized. IMPRESSION: Pulmonary vascular congestion with new interstitial infiltrates question pulmonary edema. Aortic Atherosclerosis (ICD10-I70.0). Electronically Signed   By: Ulyses Southward M.D.   On: 08/19/2021 08:28   ECHOCARDIOGRAM COMPLETE  Result Date: 08/19/2021    ECHOCARDIOGRAM REPORT   Patient Name:   LORENE KLIMAS Date of Exam: 08/19/2021 Medical Rec #:  [id number]      Height:       65.0 in Accession #:    [id number]     Weight:       134.5 lb Date of Birth:  [birth year]      BSA:          1.671 m Patient Age:    89 years       BP:           92/45 mmHg Patient Gender: F              HR:           83 bpm. Exam Location:  [facility] Procedure: 2D Echo, Color Doppler and Cardiac Doppler Indications:     I50.21 congestive heart failure-Acute Systolic  History:         Patient has prior history of Echocardiogram examinations.                  Pacemaker, Arrythmias:Atrial Fibrillation; Risk                  Factors:Hypertension.  Sonographer:     Humphrey Rolls Referring Phys:  [id number] [facility] Shaketha Jeon Diagnosing Phys: Sena Slate IMPRESSIONS  1. Left ventricular ejection fraction, by estimation, is 60 to 65%. The left ventricle has normal function. The left ventricle has no regional wall motion abnormalities. There is mild left ventricular hypertrophy. Left ventricular diastolic parameters are indeterminate.  2. Right ventricular systolic function is normal. The right ventricular size is normal.  3. Left atrial size was mildly dilated.  4. The mitral valve is degenerative. Trivial mitral valve regurgitation. No evidence of mitral stenosis.  5. Tricuspid valve regurgitation is moderate.  6. The aortic valve is normal in structure. Aortic valve regurgitation is not visualized. Aortic valve sclerosis is present, with no evidence of aortic valve stenosis. FINDINGS  Left Ventricle: Left ventricular ejection fraction, by estimation, is 60  to 65%.  The left ventricle has normal function. The left ventricle has no regional wall motion abnormalities. The left ventricular internal cavity size was normal in size. There is  mild left ventricular hypertrophy. Left ventricular diastolic parameters are indeterminate. Right Ventricle: The right ventricular size is normal. Right vetricular wall thickness was not well visualized. Right ventricular systolic function is normal. Left Atrium: Left atrial size was mildly dilated. Right Atrium: Right atrial size was normal in size. Pericardium: There is no evidence of pericardial effusion. Mitral Valve: The mitral valve is degenerative in appearance. Trivial mitral valve regurgitation. No evidence of mitral valve stenosis. MV peak gradient, 6.4 mmHg. The mean mitral valve gradient is 2.0 mmHg. Tricuspid Valve: The tricuspid valve is normal in structure. Tricuspid valve regurgitation is moderate. Aortic Valve: The aortic valve is normal in structure. Aortic valve regurgitation is not visualized. Aortic valve sclerosis is present, with no evidence of aortic valve stenosis. Aortic valve mean gradient measures 4.0 mmHg. Aortic valve peak gradient measures 7.2 mmHg. Aortic valve area, by VTI measures 1.49 cm. Pulmonic Valve: The pulmonic valve was not well visualized. Aorta: The aortic root is normal in size and structure. Venous: The inferior vena cava was not well visualized. IAS/Shunts: No atrial level shunt detected by color flow Doppler. Additional Comments: A device lead is visualized.  LEFT VENTRICLE PLAX 2D LVIDd:         3.66 cm   Diastology LVIDs:         2.68 cm   LV e' medial:    6.42 cm/s LV PW:         1.15 cm   LV E/e' medial:  18.2 LV IVS:        0.83 cm   LV e' lateral:   5.55 cm/s LVOT diam:     2.10 cm   LV E/e' lateral: 21.1 LV SV:         29 LV SV Index:   18 LVOT Area:     3.46 cm  RIGHT VENTRICLE RV Basal diam:  2.77 cm LEFT ATRIUM             Index        RIGHT ATRIUM          Index LA diam:        3.90 cm  2.33 cm/m   RA Area:     7.90 cm LA Vol (A2C):   81.5 ml 48.77 ml/m  RA Volume:   11.80 ml 7.06 ml/m LA Vol (A4C):   45.0 ml 26.93 ml/m LA Biplane Vol: 66.6 ml 39.85 ml/m  AORTIC VALVE                    PULMONIC VALVE AV Area (Vmax):    1.57 cm     PV Vmax:       0.93 m/s AV Area (Vmean):   1.48 cm     PV Vmean:      65.800 cm/s AV Area (VTI):     1.49 cm     PV VTI:        0.153 m AV Vmax:           134.00 cm/s  PV Peak grad:  3.5 mmHg AV Vmean:          89.800 cm/s  PV Mean grad:  2.0 mmHg AV VTI:            0.197 m AV Peak Grad:  7.2 mmHg AV Mean Grad:      4.0 mmHg LVOT Vmax:         60.70 cm/s LVOT Vmean:        38.400 cm/s LVOT VTI:          0.085 m LVOT/AV VTI ratio: 0.43  AORTA Ao Root diam: 3.50 cm MITRAL VALVE                TRICUSPID VALVE MV Area (PHT): 3.36 cm     TR Peak grad:   30.7 mmHg MV Area VTI:   1.07 cm     TR Vmax:        277.00 cm/s MV Peak grad:  6.4 mmHg MV Mean grad:  2.0 mmHg     SHUNTS MV Vmax:       1.26 m/s     Systemic VTI:  0.08 m MV Vmean:      68.1 cm/s    Systemic Diam: 2.10 cm MV Decel Time: 226 msec MV E velocity: 117.00 cm/s Sena Slate Electronically signed by Sena Slate Signature Date/Time: 08/19/2021/4:56:02 PM    Final       Assessment/Plan Principal Problem:   CHF (congestive heart failure) (HCC) Active Problems:   HOH (hard of hearing)   Acute on chronic systolic CHF (congestive heart failure) (HCC)   Leukocytosis   Elevated troponin   Essential hypertension   Acute respiratory failure with hypoxia (HCC)   PAF (paroxysmal atrial fibrillation) (HCC)    Acute respiratory failure with hypoxia Presented with shortness of breath and hypoxia to 87% on room air  2/2 acute systolic heart failure and volume overload BNP elevated Echo from 2017 EF 45 to 50% Obtain echo today, nml EF, diastolic function indeterminant Cardiology consulted Lasix given Will continue low dose lasix 20mg  iv , monitoring for hypotension Keep O2 sats above 92% with  O2 supplementation Palliative consulted, family can decide on hospice    2.  Acute on chronic systolic heart failure Echo as above Lasix IV as above I's and O's Daily weight Conservative mx per cardiology    3. Elevated TP Likely due to demand ischemia from above Echo today with normal EF and no regional wall motion abnormality Cardiology following Was given asa 325mg , then 81mg  therafter. On  beta-blockers Defer heparin drip since on Coumadin   4.PAF S/PPPM NSVT On coumadin Inr therapeutic Pharmacy consulted for coumadin mx On beta blk  Echo nml ef and no wma  5. Essential HTN Stable.  Continue current management.     DVT prophylaxis: coumadin  Code Status: DNR  Family Communication: daughter in law  Disposition Plan: TBD  Consults called: cardiology and palliative care  Admission status: in patient as patient requires >2MN stays, receiving iv treatment, and complexity of her care requires hospitalization.    MD [facility]   If 7PM-7AM, please contact night-coverage [email or web address] Password [facility]  08/19/2021, 5:26 PM

## 2021-08-19 NOTE — ED Provider Notes (Signed)
[facility] Provider Note    Event Date/Time   First MD Initiated Contact with Patient 08/19/21 [phone]     (approximate)   History   Shortness of Breath Level V caveatL:  HOH  HPI  Brandi Aguirre is a 88 y.o. female with a history of A-fib, hypertension CHF reportedly DNR very hard of hearing presents to the ER for worsening shortness of breath.  Reportedly not on Lasix for the past several months.  She denies any pain or pressure.  Does not reportedly wear home oxygen was found to be 87% on room air by EMS.     Physical Exam   Triage Vital Signs: ED Triage Vitals  Enc Vitals Group     BP 08/19/21 0808 (!) 164/100     Pulse Rate 08/19/21 0808 72     Resp 08/19/21 0808 18     Temp 08/19/21 0808 (!) 97.5 F (36.4 C)     Temp Source 08/19/21 0808 Oral     SpO2 08/19/21 0808 95 %     Weight 08/19/21 0807 134 lb 7.7 oz (61 kg)     Height --      Head Circumference --      Peak Flow --      Pain Score --      Pain Loc --      Pain Edu? --      Excl. in GC? --     Most recent vital signs: Vitals:   08/19/21 0808 08/19/21 0900  BP: (!) 164/100 (!) 161/58  Pulse: 72 (!) 44  Resp: 18 (!) 30  Temp: (!) 97.5 F (36.4 C)   SpO2: 95% 94%     Constitutional: Alert, frail and ill appearing Eyes: Conjunctivae are normal.  Head: Atraumatic. Nose: No congestion/rhinnorhea. Mouth/Throat: Mucous membranes are moist.   Neck: Painless ROM.  Cardiovascular:   Good peripheral circulation. No m/g/r Respiratory: Mild tachypnea with bibasilar crackles no wheeze. Gastrointestinal: Soft and nontender.  Musculoskeletal:  no deformity Neurologic:  MAE spontaneously. No gross focal neurologic deficits are appreciated.  Skin:  Skin is warm, dry and intact. No rash noted. Psychiatric: Mood and affect are normal. Speech and behavior are normal.    ED Results / Procedures / Treatments   Labs (all labs ordered are listed, but only abnormal results are  displayed) Labs Reviewed  CBC - Abnormal; Notable for the following components:      Result Value   WBC 12.4 (*)    Platelets 136 (*)    All other components within normal limits  COMPREHENSIVE METABOLIC PANEL - Abnormal; Notable for the following components:   Glucose, Bld 140 (*)    Calcium 8.8 (*)    GFR, Estimated 54 (*)    All other components within normal limits  BRAIN NATRIURETIC PEPTIDE - Abnormal; Notable for the following components:   B Natriuretic Peptide 646.9 (*)    All other components within normal limits  PROTIME-INR - Abnormal; Notable for the following components:   Prothrombin Time 23.5 (*)    INR 2.1 (*)    All other components within normal limits  TROPONIN I (HIGH SENSITIVITY) - Abnormal; Notable for the following components:   Troponin I (High Sensitivity) 99 (*)    All other components within normal limits     EKG  ED ECG REPORT I, Willy Eddy, the attending physician, personally viewed and interpreted this ECG.   Date: 08/19/2021  EKG Time: 9:07  Rate: 65  Rhythm: sinus  Axis: normal  Intervals:normal  ST&T Change:  no stemi    RADIOLOGY    PROCEDURES:  Critical Care performed: Yes, see critical care procedure note(s)  .Critical Care Performed by: Willy Eddy, MD Authorized by: Willy Eddy, MD   Critical care provider statement:    Critical care time (minutes):  35   Critical care was necessary to treat or prevent imminent or life-threatening deterioration of the following conditions:  Respiratory failure   Critical care was time spent personally by me on the following activities:  Ordering and performing treatments and interventions, ordering and review of laboratory studies, ordering and review of radiographic studies, pulse oximetry, re-evaluation of patient's condition, review of old charts, obtaining history from patient or surrogate, examination of patient, evaluation of patient's response to treatment,  discussions with primary provider, discussions with consultants and development of treatment plan with patient or surrogate   MEDICATIONS ORDERED IN ED: Medications  nitroGLYCERIN (NITROSTAT) SL tablet 0.4 mg (0.4 mg Sublingual Given 08/19/21 0925)  furosemide (LASIX) injection 40 mg (40 mg Intravenous Given 08/19/21 0925)     IMPRESSION / MDM / ASSESSMENT AND PLAN / ED COURSE  I reviewed the triage vital signs and the nursing notes.                              Differential diagnosis includes, but is not limited to, Asthma, copd, CHF, pna, ptx, malignancy, Pe, anemia   Patient presented to the ER for evaluation of acute shortness of breath with acute respiratory failure with hypoxia as described above she is afebrile mildly hypertensive protecting her airway.  Evaluation somewhat limited due to patient being very poor historian secondary to very hard of hearing.  This presenting complaint could reflect a potentially life-threatening illness therefore the patient will be placed on continuous pulse oximetry and telemetry for monitoring.  Laboratory evaluation will be sent to evaluate for the above complaints.     Clinical Course as of 08/19/21 0935  Wed Aug 19, 2021  0928 Chest x-ray on my review and interpretation does show evidence of pulmonary edema.  Mild leukocytosis but no fever BNP is elevated troponin is elevated 99 likely secondary to demand ischemia in the setting of hypoxia.  She is anticoagulated with an INR of 2.1.  Daughter says the patient is DNR but does want to be treated for her heart failure.  Will order IV Lasix.  Patient was given nitro to help with her pressures.  She was remained pain-free.  Hospitalist consulted for admission. [PR]    Clinical Course User Index [PR] Willy Eddy, MD    Patient's presentation is most consistent with severe exacerbation of chronic illness.   FINAL CLINICAL IMPRESSION(S) / ED DIAGNOSES   Final diagnoses:  Acute respiratory  failure with hypoxia (HCC)  Acute on chronic congestive heart failure, unspecified heart failure type (HCC)     Rx / DC Orders   ED Discharge Orders     None        Note:  This document was prepared using Dragon voice recognition software and may include unintentional dictation errors.    Willy Eddy, MD 08/19/21 [phone]

## 2021-08-19 NOTE — ED Triage Notes (Signed)
Pt comes ems from home. Called daughter saying she wasn't feeling well. Was87% on RA upon ems arrival. Placed on 3L [place] and is up to 95%. Took her hearing aids out before transport. Cough present. Was here recently for a fall. CBG 166. Afib on the monitor. BP 201/78. Has not had any of her meds today. Lives alone.

## 2021-08-19 NOTE — ED Notes (Signed)
Using bold letter writing, pt states she is not in any pain right now. Endorses shortness of breath. Is unable to tell me where we are or what year it is.

## 2021-08-19 NOTE — Progress Notes (Signed)
*  PRELIMINARY RESULTS* Echocardiogram 2D Echocardiogram has been performed.  Wallie Char Bufford Helms 08/19/2021, 1:54 PM

## 2021-08-19 NOTE — Consult Note (Signed)
[place] NOTE       Patient ID: Brandi Aguirre MRN: [id number] DOB/AGE: [birth year] 88 y.o.  Admit date: 08/19/2021 Referring Physician Dr. Nolberto Hanlon  Primary Physician Dr. Johny Drilling Primary Cardiologist Dr. Ubaldo Glassing (last seen in 2018)  Reason for Consultation elevated troponin, AoCHF  HPI: Brandi Aguirre is a 88yoF who is very hard of hearing with a PMH of  paroxysmal atrial fibrillation on warfarin, SSS s/p St. 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  Cardiology is consulted for further assistance.  The patient is extremely hard of hearing and there is no family at bedside so history is mostly obtained from the chart.  She is able to tell me she has been short of breath for a few days and denies chest pain but does not respond to further questions and says she cannot hear.  She has reportedly not been on Lasix for several months and was hypoxic to 87 per EMS.  She was hypertensive to 160/100 on presentation and SPO2 95% on 3 L.  Initial troponin was 99 up trended sharply to 914 on the second repeat.  Her BNP is elevated to 646, some crackles to auscultation of her lungs.  Chart review from prior admission in 2017 shows a similar presentation of an elevated troponin and BNP markedly elevated to 2300 and troponin elevation equivalent to about 2000, thought to be demand ischemia in the setting of pulmonary edema and volume overload treated medically.  Review of systems unable to assess completely due to patient's hearing difficulties.   Past Medical History:  Diagnosis Date   Atrial fibrillation [facility])    Cochlear implant in place    Hypertension    Presence of permanent cardiac pacemaker    Sick sinus syndrome ([place])    Wears dentures     Past Surgical  History:  Procedure Laterality Date   ABDOMINAL HYSTERECTOMY     COCHLEAR IMPLANT     ECTROPION REPAIR Right 01/16/2021   Procedure: ECTROPION REPAIR, TARSAL WEDGE ECTROPION REPAIR, EXTENSIVE RIGHT;  Surgeon: Karle Starch, MD;  Location: [place];  Service: Ophthalmology;  Laterality: Right;   PACEMAKER INSERTION      (Not in a hospital admission)  Social History   Socioeconomic History   Marital status: Widowed    Spouse name: Not on file   Number of children: Not on file   Years of education: Not on file   Highest education level: Not on file  Occupational History   Not on file  Tobacco Use   Smoking status: Former    Types: Cigarettes   Smokeless tobacco: Never   Tobacco comments:    Quit smoking over 50 yrs ago  Vaping Use   Vaping Use: Never used  Substance and Sexual Activity   Alcohol use: Not Currently   Drug use: Not on file   Sexual activity: Not on file  Other Topics Concern   Not on file  Social History Narrative   Not on file   Social Determinants of Health   Financial Resource Strain: Not on file  Food Insecurity: Not on file  Transportation Needs: Not on file  Physical Activity: Not on file  Stress: Not on file  Social Connections: Not on file  Intimate Partner Violence: Not on file  History reviewed. No pertinent family history.    PHYSICAL EXAM General: Elderly, frail and ill-appearing Caucasian female in no acute distress.  Lying flat in ED stretcher. Extremely hard of hearing HEENT:  Normocephalic and atraumatic. Right eye with erythematous conjunctiva  Neck:  No JVD.  Lungs: Normal respiratory effort on 3L. Bibasilar crackles without appreciable wheezes.  Heart: Irregular irregular with frequent extra beats. Normal S1 and S2 without gallops or murmurs.  Abdomen: Non-distended appearing.  Msk: Normal strength and tone for age. Extremities: Warm and well perfused. No clubbing, cyanosis. No peripheral edema.  Neuro: Alert,  unable to assess orientation due to her poor hearing.  Psych: somnolent, unable to assess  Labs:   Lab Results  Component Value Date   WBC 12.4 (H) 08/19/2021   HGB 13.6 08/19/2021   HCT 43.3 08/19/2021   MCV 95.2 08/19/2021   PLT 136 (L) 08/19/2021    Recent Labs  Lab 08/19/21 0813  NA 140  K 4.1  CL 104  CO2 27  BUN 16  CREATININE 0.95  CALCIUM 8.8*  PROT 6.7  BILITOT 0.8  ALKPHOS 68  ALT 10  AST 19  GLUCOSE 140*   Lab Results  Component Value Date   CKMB 0.6 05/07/2013   TROPONINI <0.03 10/22/2015   No results found for: CHOL No results found for: HDL No results found for: LDLCALC No results found for: TRIG No results found for: CHOLHDL No results found for: LDLDIRECT    Radiology: DG Chest Portable 1 View  Result Date: 08/19/2021 CLINICAL DATA:  Shortness of breath, hypoxemia, cough, atrial fibrillation EXAM: PORTABLE CHEST 1 VIEW COMPARISON:  Portable exam 0820 hours compared to 07/20/2021 FINDINGS: LEFT subclavian pacemaker leads project over RIGHT atrium and RIGHT ventricle. Upper normal heart size with pulmonary vascular congestion. Atherosclerotic calcification aorta. Scattered interstitial infiltrates bilaterally, favor pulmonary edema. No pleural effusion or pneumothorax. Bones demineralized. IMPRESSION: Pulmonary vascular congestion with new interstitial infiltrates question pulmonary edema. Aortic Atherosclerosis (ICD10-I70.0). Electronically Signed   By: Lavonia Dana M.D.   On: 08/19/2021 08:28    ECHO 09/04/2015 LV EF: 45% -   50%   -------------------------------------------------------------------  Indications:      CHF - 428.0.   -------------------------------------------------------------------  History:   PMH:   Atrial fibrillation.  Congestive heart failure.  Risk factors:  Hypertension. Dyslipidemia.   -------------------------------------------------------------------  Study Conclusions   - Left ventricle: Wall thickness was increased  in a pattern of mild    LVH. Systolic function was mildly reduced. The estimated ejection    fraction was in the range of 45% to 50%. Akinesis of the    anteroseptal myocardium. Akinesis of the apical myocardium.    Akinesis of the anteroseptal myocardium.  - Left atrium: The atrium was mildly dilated.  - Tricuspid valve: There was moderate regurgitation.   TELEMETRY reviewed by me: atrial fibrillation with frequent PVCs and short <10 beat runs of VT  EKG reviewed by me: sinus rhythm with old anteroseptal infarct rate 67  ASSESSMENT AND PLAN:  Heydy Tomasik is a 26yoF who is very hard of hearing with a PMH of  paroxysmal atrial fibrillation on warfarin, SSS s/p St. 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  Cardiology is consulted for further assistance.  #acute on chronic HFmrEF (LVEF 45-50%, 08/2015) #  acute hypoxic respiratory failure #elevated troponin The patient presents with shortness of breath and hypoxic to 87% on room air by EMS.  History is limited due to patient's extreme difficulty hearing she is able to say she felt short of breath and does not have chest pain. Reportedly not taking lasix anymore. Her BNP is elevated to 600 and troponin is uptrending from 99-914, EKG is without acute ischemic changes.  Chest x-ray with evidence of vascular congestion suggesting volume overload.  She had a similar admission in 2017 with volume overload and elevated troponin in the setting and was managed conservatively.  She is notably a DNR and has not been seen by her outpatient cardiologist in 5 years. -trend troponin until peak -give 325mg  aspirin now, continue aspirin 81mg  dialy therafter -defer heparin drip, continue warfarin as below -continue metoprolol XL at lower dose 12.5mg  (on  50mg  at home)  -s/p lasix 40mg  x1 , continue gentle diuresis with IV lasix 40mg   -continue lisinopril 2.5mg   -echocardiogram complete -agree with palliative care input for [place] discussion with family, recommend conservative cardiology input   #SSS s/p St Jude DC PPM  #paroxysmal AF on warfarin -currently in atrial fibrillation with frequent PVCs and short runs of VT, rate controlled in the 80s -continue metoprolol and warfarin  This patient's plan of care was discussed and created with Dr. Donnelly Angelica and he is in agreement.  Signed: Tristan Schroeder , PA-C 08/19/2021, 2:13 PM [facility]

## 2021-08-19 NOTE — ED Notes (Signed)
Critical trop reported to Dr Marylu Lund

## 2021-08-19 NOTE — Progress Notes (Signed)
ANTICOAGULATION CONSULT NOTE - Initial Consult  Pharmacy Consult for continuation of Warfarin Indication: atrial fibrillation  Allergies  Allergen Reactions   Sulfa Antibiotics Rash    Patient Measurements: Weight: 61 kg (134 lb 7.7 oz) Heparin Dosing Weight:    Vital Signs: Temp: 97.5 F (36.4 C) (05/24 0808) Temp Source: Oral (05/24 0808) BP: 107/46 (05/24 1330) Pulse Rate: 83 (05/24 1345)  Labs: Recent Labs    08/19/21 0813 08/19/21 1055  HGB 13.6  --   HCT 43.3  --   PLT 136*  --   LABPROT 23.5*  --   INR 2.1*  --   CREATININE 0.95  --   TROPONINIHS 99* 914*    Estimated Creatinine Clearance: 29.8 mL/min (by C-G formula based on SCr of 0.95 mg/dL).   Medical History: Past Medical History:  Diagnosis Date   Atrial fibrillation [facility])    Cochlear implant in place    Hypertension    Presence of permanent cardiac pacemaker    Sick sinus syndrome ([facility])    Wears dentures     Medications:  PTA Warfarin: 5mg  on Thursday, 3mg  all other days  Assessment: Patient is a 88yo female with a history of afib. She is anticoagulated on warfarin. Pharmacy consulted to manage warfarin dosing.  5/24 INR on admission 2.1  Goal of Therapy:  INR 2-3 Monitor platelets by anticoagulation protocol: Yes   Plan:  Will continue with home warfarin regimen of 3mg  daily except for 5mg  on Thursday. Daily INR to guide dosing.  6/24, PharmD, BCPS 08/19/2021 2:49 PM

## 2021-08-19 NOTE — ED Notes (Signed)
Daughter in law at bedside. States that pt made herself a dnr on last hospital visit.

## 2021-08-19 NOTE — ED Notes (Signed)
Messaged MD about pt runs of vtach

## 2021-08-19 NOTE — ED Notes (Signed)
Pt placed on hospital bed and given warm blankets

## 2021-08-20 DIAGNOSIS — R778 Other specified abnormalities of plasma proteins: Secondary | ICD-10-CM | POA: Diagnosis not present

## 2021-08-20 DIAGNOSIS — H9193 Unspecified hearing loss, bilateral: Secondary | ICD-10-CM | POA: Diagnosis not present

## 2021-08-20 DIAGNOSIS — Z7189 Other specified counseling: Secondary | ICD-10-CM

## 2021-08-20 DIAGNOSIS — I480 Paroxysmal atrial fibrillation: Secondary | ICD-10-CM

## 2021-08-20 DIAGNOSIS — I10 Essential (primary) hypertension: Secondary | ICD-10-CM | POA: Diagnosis not present

## 2021-08-20 DIAGNOSIS — Z515 Encounter for palliative care: Secondary | ICD-10-CM

## 2021-08-20 DIAGNOSIS — J9601 Acute respiratory failure with hypoxia: Secondary | ICD-10-CM | POA: Diagnosis not present

## 2021-08-20 DIAGNOSIS — I5023 Acute on chronic systolic (congestive) heart failure: Secondary | ICD-10-CM | POA: Diagnosis not present

## 2021-08-20 DIAGNOSIS — Z66 Do not resuscitate: Secondary | ICD-10-CM

## 2021-08-20 LAB — BASIC METABOLIC PANEL
Anion gap: 8 (ref 5–15)
BUN: 23 mg/dL (ref 8–23)
CO2: 28 mmol/L (ref 22–32)
Calcium: 8.4 mg/dL — ABNORMAL LOW (ref 8.9–10.3)
Chloride: 105 mmol/L (ref 98–111)
Creatinine, Ser: 1.14 mg/dL — ABNORMAL HIGH (ref 0.44–1.00)
GFR, Estimated: 44 mL/min — ABNORMAL LOW (ref >60)
Glucose, Bld: 86 mg/dL (ref 70–99)
Potassium: 3.8 mmol/L (ref 3.5–5.1)
Sodium: 141 mmol/L (ref 135–145)

## 2021-08-20 LAB — PROTIME-INR
INR: 1.6 — ABNORMAL HIGH (ref 0.8–1.2)
Prothrombin Time: 19 seconds — ABNORMAL HIGH (ref 11.4–15.2)

## 2021-08-20 LAB — CBC
HCT: 37.8 % (ref 36.0–46.0)
Hemoglobin: 11.9 g/dL — ABNORMAL LOW (ref 12.0–15.0)
MCH: 30.1 pg (ref 26.0–34.0)
MCHC: 31.5 g/dL (ref 30.0–36.0)
MCV: 95.5 fL (ref 80.0–100.0)
Platelets: 118 10*3/uL — ABNORMAL LOW (ref 150–400)
RBC: 3.96 MIL/uL (ref 3.87–5.11)
RDW: 13.6 % (ref 11.5–15.5)
WBC: 6.3 10*3/uL (ref 4.0–10.5)
nRBC: 0 % (ref 0.0–0.2)

## 2021-08-20 LAB — TROPONIN I (HIGH SENSITIVITY)
Troponin I (High Sensitivity): 579 ng/L (ref <18)
Troponin I (High Sensitivity): 521 ng/L (ref <18)

## 2021-08-20 MED ORDER — ASPIRIN 81 MG PO CHEW
81.0000 mg | CHEWABLE_TABLET | Freq: Every day | ORAL | Status: DC
Start: 2021-08-20 — End: 2021-08-26
  Administered 2021-08-20 – 2021-08-25 (×6): 81 mg via ORAL
  Filled 2021-08-20 (×7): qty 1

## 2021-08-20 MED ORDER — HYDRALAZINE HCL 20 MG/ML IJ SOLN
5.0000 mg | Freq: Four times a day (QID) | INTRAMUSCULAR | Status: DC | PRN
  Administered 2021-08-21 (×2): 5 mg via INTRAVENOUS
  Filled 2021-08-20 (×3): qty 1

## 2021-08-20 NOTE — Consult Note (Signed)
Palliative Care Consult Note                                  Date: 08/20/2021   Patient Name: Brandi Aguirre  DOB: 1921-08-25  MRN: [id number]  Age / Sex: 88 y.o., female  PCP: Valera Castle, MD Referring Physician: Nolberto Hanlon, MD  Reason for Consultation: Establishing goals of care  HPI/Patient Profile: 88 y.o. female  with past medical history of A-fib on coumadin, hypertension , hard of hearing, PPM placement was brought by EMS after she called her daughter in law this am stating she is sob and cold. Patient found in heart failure and oxygen saturations of 80% 1% on room air on presentation secondary to systolic heart failure and volume overload, elevated BNP she was admitted on 08/19/2021 with acute respiratory failure with hypoxia, acute on chronic systolic heart failure, elevated troponins, A-fib, and others.  Current treatment plan includes diuretics, strict INO's, daily weights.  PMT was consulted for goals of care conversation given substantial chronic disease burden and advancing age.  Past Medical History:  Diagnosis Date   Atrial fibrillation [facility])    Cochlear implant in place    Hypertension    Presence of permanent cardiac pacemaker    Sick sinus syndrome ([place])    Wears dentures     Subjective:   This NP Walden Field reviewed medical records, received report from team, assessed the patient and then meet at the patient's bedside to discuss diagnosis, prognosis, GOC, EOL wishes disposition and options.  I met with the patient at the bedside he was soundly sleeping and I elected to not wake her as I felt he would startle her given that she is "essentially deaf".  I called and spoke with her daughter-in-law (closest living relative) for phone conversation.   Concept of Palliative Care was introduced as specialized medical care for people and their families living with serious illness.  If focuses on providing relief from  the symptoms and stress of a serious illness.  The goal is to improve quality of life for both the patient and the family. Values and goals of care important to patient and family were attempted to be elicited.  Created space and opportunity for patient  and family to explore thoughts and feelings regarding current medical situation   Natural trajectory and current clinical status were discussed. Questions and concerns addressed. Patient  encouraged to call with questions or concerns.    Patient/Family Understanding of Illness: She understands that she has been going downhill more recently, especially in the past couple months.  She is slowing down, losing interest in things, not interacting as much with family.  We discussed her chronic and acute health situations, but that she appears to be stable worse may be somewhat improving since yesterday.  However, expressed that overall she will continue to have decline exacerbations of chronic disease.  Also of note the patient is "essentially deaf" (very hard of hearing) and has recently started using a white board to communicate, which is at her bedside.  Life Review: The patient is a widow.  She has 1 son who is raised by his grandparents and she was not in her son's life very much.  However, unfortunately her son passed away in May 08, 2022.  Her daughter-in-law continues to care for her.  She enjoyed crafts, crochet, needlepoint.  However, in the past 1 to 2 years she has lost  interest in her previous hobbies.  Her daughter states that she is sensitive to sit from a chair all day.  Patient Values: Family  Goals: Per the patient's daughter the patient said that "I do not know why they want me to go back to rehab can even stand up."  Additionally her daughter states the patient said "I am so tired of doing all of this."  We discussed that options moving forward could include hospice care and focus on staying out of the hospital and providing comfort and  dignity in her some side years.  The patient's daughter seems open to this.  Today's Discussion: In addition to the discussions described above we had substantial and prolonged discussions on multiple topics.  The patient is already made funeral plans.  Previously she was taken aback by her son's desire to be cremated but now she is asked to be cremated and sent back to [place] where her husband is very.  We explored significant decline in interest.  I question whether this portends a depression state since the passing of her son and with her general slowing.  She currently lives in an apartment at a retirement facility.  When discussing hospice her daughter states that her pain is that if she could choose, she would stay at home in her chair.  She would prefer to continue staying at home and avoid hospitalization.  However, she states that even though she is expressed sentiments that seem to point to acceptance of hospice care philosophy and desire to not keep coming back to the hospital, she will generally agree with what ever is recommended to her.  I have recommended a family meeting with the patient and the patient's daughter-in-law as well as myself tomorrow morning so we can try to truly understand what the patient herself wishes rather than what she is just acquiescing today with.  Her daughter is on board with this plan and we plan to meet tomorrow at 10 AM.  Review of Systems  Unable to perform ROS: Other (sleeping, chose to not startle/awaken)  Objective:   Primary Diagnoses: Present on Admission: **None**   Physical Exam Vitals and nursing note reviewed.  Constitutional:      Comments: Sleeping soundly  HENT:     Head: Normocephalic and atraumatic.  Cardiovascular:     Rate and Rhythm: Normal rate.  Pulmonary:     Effort: Pulmonary effort is normal. No respiratory distress.    Vital Signs:  BP (!) 191/65   Pulse (!) 59   Temp 98.3 F (36.8 C)   Resp 14   Ht 5' 5"   (1.651 m)   Wt 55 kg   SpO2 100%   BMI 20.18 kg/m   Palliative Assessment/Data: 40-50%    Advanced Care Planning:   Primary Decision Maker: OTHER: Unable to determine at this point  Code Status/Advance Care Planning: DNR  A discussion was had today regarding advanced directives. Concepts specific to code status, artifical feeding and hydration, continued IV antibiotics and rehospitalization was had.  The difference between a aggressive medical intervention path and a palliative comfort care path for this patient at this time was had.   Decisions/Changes to ACP: None today  Assessment & Plan:   Impression: 88 year old female with advancing age, multiple chronic comorbidities, noted decline at home and loss of interest in things that typically brought her joy.  She is admitted now with acute heart failure with hypoxia, A-fib.  She seems to be stable or slightly improving.  Given her multiple chronic illnesses, 2 admissions and ED visits since October 2022 she would benefit from goals of care conversation.  Her daughter is concerned she will agree to what ever is recommended.  We will attempt to elicit the patient's actual goals for care.  Based on my conversation with the patient's daughter-in-law it seems the patient may acquiesce to hospice care given her clinical situations and apparent declining quality of life.  SUMMARY OF RECOMMENDATIONS   Remain DNR Plan for family meeting tomorrow at 58 AM Continued patient and family support PMT will continue to follow  Symptom Management:  Per primary team PMT is available to assist as needed  Prognosis:  Unable to determine  Discharge Planning:  To Be Determined   Discussed with: Patient's family, medical team, nursing team    Thank you for allowing [place] to participate in the care of Brandi Aguirre PMT will continue to support holistically.  Billing based on MDM: High  Problems Addressed: One acute or chronic illness or  injury that poses a threat to life or bodily function  Amount and/or Complexity of Data: Category 1:Review of prior external note(s) from each unique source and Review of the result(s) of each unique test and Category 3:Discussion of management or test interpretation with external physician/other qualified health care professional/appropriate source (not separately reported) (reviewed admission labs including INR, CBC, BMP, troponin, BNP; reviewed recent family medicine outpatient notes)  Risks: Decision not to resuscitate or to de-escalate care because of poor prognosis   Signed by: Walden Field, NP Palliative Medicine Team  Team Phone # [phone] (Nights/Weekends)  08/20/2021, 4:43 PM

## 2021-08-20 NOTE — Evaluation (Signed)
Physical Therapy Evaluation Patient Details Name: Brandi Aguirre MRN: [id number] DOB: [birth year] Today's Date: 08/20/2021  History of Present Illness  5yoF who is very hard of hearing with a PMH of  paroxysmal atrial fibrillation on warfarin, SSS s/p St. 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   Clinical Impression  Patient alert, sitting with OT at bedside. Pt oriented, but communicates purely with whiteboard, able to read and answer all questions, family at bedside also assists with answering questions as needed. Per OT and pt/family, pt at baseline lives at an ALF, and uses a rollator.   The patient was able to stand twice with PT, minA for steadying and to achieve fully upright positioning. She was able to ambulate ~37ft with RW and CGA-minA, and after a seated rest break an additional 5ft. Pt with SEVERAL LOB that required at least minA to correct, staggering, shuffled steps noted. Pt also endorsed SOB with activity, HR and spO2 WFLs (on 2.5L via [place] throughout session). Pt also reported fatigue.  Overall the patient demonstrated deficits (see "PT Problem List") that impede the patient's functional abilities, safety, and mobility and would benefit from skilled PT intervention. Recommendation at this time is SNF due to current level of assistance needed and to improve function and safety.        Recommendations for follow up therapy are one component of a multi-disciplinary discharge planning process, led by the attending physician.  Recommendations may be updated based on patient status, additional functional criteria and insurance authorization.  Follow Up Recommendations Skilled nursing-short term rehab (<3 hours/day)    Assistance Recommended at Discharge  Frequent or constant Supervision/Assistance  Patient can return home with the following  A little help with bathing/dressing/bathroom;Assistance with cooking/housework;Assist for transportation;Help with stairs or ramp for entrance;A lot of help with walking and/or transfers    Equipment Recommendations Other (comment) (TBD)  Recommendations for Other Services       Functional Status Assessment Patient has had a recent decline in their functional status and demonstrates the ability to make significant improvements in function in a reasonable and predictable amount of time.     Precautions / Restrictions Precautions Precautions: Fall Restrictions Weight Bearing Restrictions: No      Mobility  Bed Mobility               General bed mobility comments: sitting EOB with OT at bedside    Transfers Overall transfer level: Needs assistance Equipment used: Rolling walker (2 wheels) Transfers: Sit to/from Stand Sit to Stand: Min assist                Ambulation/Gait Ambulation/Gait assistance: Min assist, Min guard Gait Distance (Feet):  (50ft, seated rest break then additional 49ft) Assistive device: Rolling walker (2 wheels)         General Gait Details: several LOB during each ambulation bout requiring at least minA to correct, majority of LOB posterior  Stairs            Wheelchair Mobility    Modified Rankin (Stroke Patients Only)       Balance Overall balance assessment: Needs assistance Sitting-balance support: Single extremity supported, Feet supported Sitting balance-Leahy Scale: Fair     Standing balance support: Reliant on assistive device for balance Standing balance-Leahy Scale: Poor Standing  balance comment: minA to come up into standing, and then several bouts of LOB due to posterior bias                             Pertinent Vitals/Pain Pain Assessment Pain Assessment: No/denies pain    Home Living Family/patient  expects to be discharged to:: Assisted living Living Arrangements: Alone               Home Equipment: Rollator (4 wheels);Grab bars - toilet;Shower seat Additional Comments: daughter in law provided most info, walk in shower    Prior Function Prior Level of Function : Needs assist             Mobility Comments: pt ambulating with rollator, SPC for short community distances, w/c for dr appts ADLs Comments: Pt mod indep with basic ADL primarily from seated position, assist from daughter in law and facility for IADL     Hand Dominance        Extremity/Trunk Assessment   Upper Extremity Assessment Upper Extremity Assessment: Generalized weakness    Lower Extremity Assessment Lower Extremity Assessment: Generalized weakness       Communication   Communication: HOH (use of dry erase board)  Cognition Arousal/Alertness: Awake/alert Behavior During Therapy: WFL for tasks assessed/performed Overall Cognitive Status: Difficult to assess                                 General Comments: alert and follows commands well with written instruction        General Comments General comments (skin integrity, edema, etc.): On 2.5L O2, SpO2 96% or greater    Exercises     Assessment/Plan    PT Assessment Patient needs continued PT services  PT Problem List         PT Treatment Interventions DME instruction;Therapeutic exercise;Gait training;Balance training;Stair training;Neuromuscular re-education;Functional mobility training;Therapeutic activities;Patient/family education    PT Goals (Current goals can be found in the Care Plan section)  Acute Rehab PT Goals Patient Stated Goal: to go home PT Goal Formulation: With patient Time For Goal Achievement: 09/03/21 Potential to Achieve Goals: Fair    Frequency Min 2X/week     Co-evaluation               AM-PAC PT "6 Clicks" Mobility  Outcome Measure Help needed turning from your back to your  side while in a flat bed without using bedrails?: A Little Help needed moving from lying on your back to sitting on the side of a flat bed without using bedrails?: A Little Help needed moving to and from a bed to a chair (including a wheelchair)?: A Little Help needed standing up from a chair using your arms (e.g., wheelchair or bedside chair)?: A Little Help needed to walk in hospital room?: A Little Help needed climbing 3-5 steps with a railing? : A Lot 6 Click Score: 17    End of Session Equipment Utilized During Treatment: Oxygen Activity Tolerance: Patient tolerated treatment well Patient left: in chair;with chair alarm set;with call bell/phone within reach Nurse Communication: Mobility status PT Visit Diagnosis: Difficulty in walking, not elsewhere classified (R26.2);Other abnormalities of gait and mobility (R26.89);Muscle weakness (generalized) (M62.81)    Time: [id number] PT Time Calculation (min) (ACUTE ONLY): 20 min   Charges:   PT Evaluation $PT Eval Moderate Complexity: 1 Mod PT Treatments $Therapeutic Exercise: 8-22 mins  Lieutenant Diego PT, DPT 1:11 PM,08/20/21

## 2021-08-20 NOTE — Evaluation (Signed)
Occupational Therapy Evaluation Patient Details Name: Brandi Aguirre MRN: [id number] DOB: October 26, 1921 Today's Date: 08/20/2021   History of Present Illness 65yoF who is very hard of hearing with a PMH of  paroxysmal atrial fibrillation on warfarin, SSS s/p St. 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   Clinical Impression   Pt was seen for OT evaluation this date. Prior to hospital admission, pt was mod indep with basic ADL (primarily from seated position), ambulating with rollator for short household distances, and received assist from daughter in law and ALF for IADL. Currently pt demonstrates impairments as described below (See OT problem list) which functionally limit her ability to perform ADL/self-care tasks. Pt required MIN-MOD A for LB dressing to doff/don briefs, pt able to apply pad to brief, and MIN A for ADL transfers. Once steady in standing, she was able to complete pericare in standing with set up and CGA. Endorsed feeling SOB with standing >1-42min. SpO2 >96% throughout. Pt would benefit from skilled OT services to address noted impairments and functional limitations (see below for any additional details) in order to maximize safety and independence while minimizing falls risk and caregiver burden. Upon hospital discharge, recommend STR to maximize pt safety and return to PLOF.     Recommendations for follow up therapy are one component of a multi-disciplinary discharge planning process, led by the attending physician.  Recommendations may be updated based on patient status, additional functional criteria and insurance authorization.   Follow Up Recommendations  Skilled nursing-short term rehab (<3 hours/day)    Assistance Recommended at Discharge Frequent or  constant Supervision/Assistance  Patient can return home with the following A little help with walking and/or transfers;A little help with bathing/dressing/bathroom;Assistance with cooking/housework;Assist for transportation;Help with stairs or ramp for entrance;Direct supervision/assist for medications management    Functional Status Assessment  Patient has had a recent decline in their functional status and demonstrates the ability to make significant improvements in function in a reasonable and predictable amount of time.  Equipment Recommendations  None recommended by OT    Recommendations for Other Services       Precautions / Restrictions Precautions Precautions: Fall Restrictions Weight Bearing Restrictions: No      Mobility Bed Mobility Overal bed mobility: Needs Assistance Bed Mobility: Supine to Sit     Supine to sit: Min assist, HOB elevated          Transfers Overall transfer level: Needs assistance Equipment used: Rolling walker (2 wheels) Transfers: Sit to/from Stand Sit to Stand: Min assist                  Balance Overall balance assessment: Needs assistance Sitting-balance support: Single extremity supported, Feet supported Sitting balance-Leahy Scale: Fair     Standing balance support: Single extremity supported, Bilateral upper extremity supported, During functional activity, Reliant on assistive device for balance Standing balance-Leahy Scale: Poor Standing balance comment: initial standing balance poor requiring a moment to collect herself wiht heavy BUE support on RW then able to complete pericare in standing with UE support on RW                           ADL either performed or assessed with clinical judgement   ADL  General ADL Comments: Pt required MIN-MOD A for LB dressing to doff/don briefs, pt able to apply pad to brief, and MIN A for ADL transfers. Once steady  in standing, she was able to complete pericare in standing with set up and CGA. Endorsed feeling SOB with standing >1-39min. SpO2 >96% throughout.     Vision         Perception     Praxis      Pertinent Vitals/Pain Pain Assessment Pain Assessment: No/denies pain     Hand Dominance     Extremity/Trunk Assessment Upper Extremity Assessment Upper Extremity Assessment: Generalized weakness   Lower Extremity Assessment Lower Extremity Assessment: Generalized weakness       Communication Communication Communication: HOH (use dry erase board)   Cognition Arousal/Alertness: Awake/alert Behavior During Therapy: WFL for tasks assessed/performed Overall Cognitive Status: Difficult to assess                                 General Comments: alert and follows commands well with written instruction     General Comments  On 2.5L O2, SpO2 96% or greater    Exercises     Shoulder Instructions      Home Living Family/patient expects to be discharged to:: Assisted living Living Arrangements: Alone                           Home Equipment: Rollator (4 wheels);Grab bars - toilet;Shower seat   Additional Comments: daughter in law provided most info, walk in shower      Prior Functioning/Environment Prior Level of Function : Needs assist             Mobility Comments: pt ambulating with rollator, SPC for short community distances, w/c for dr appts ADLs Comments: Pt mod indep with basic ADL primarily from seated position, assist from daughter in law and facility for IADL        OT Problem List: Decreased strength;Decreased activity tolerance;Impaired balance (sitting and/or standing);Decreased knowledge of use of DME or AE      OT Treatment/Interventions: Self-care/ADL training;Therapeutic activities;Therapeutic exercise;DME and/or AE instruction;Energy conservation;Patient/family education;Balance training    OT Goals(Current goals can be  found in the care plan section) Acute Rehab OT Goals Patient Stated Goal: get better and go home OT Goal Formulation: With patient/family Time For Goal Achievement: 09/03/21 Potential to Achieve Goals: Good ADL Goals Pt Will Perform Lower Body Dressing: with modified independence Pt Will Transfer to Toilet: with supervision;ambulating (LRAD) Pt Will Perform Toileting - Clothing Manipulation and hygiene: with modified independence  OT Frequency: Min 2X/week    Co-evaluation              AM-PAC OT "6 Clicks" Daily Activity     Outcome Measure Help from another person eating meals?: None Help from another person taking care of personal grooming?: A Little Help from another person toileting, which includes using toliet, bedpan, or urinal?: A Little Help from another person bathing (including washing, rinsing, drying)?: A Lot Help from another person to put on and taking off regular upper body clothing?: A Little Help from another person to put on and taking off regular lower body clothing?: A Lot 6 Click Score: 17   End of Session Equipment Utilized During Treatment: Rolling walker (2 wheels);Oxygen  Activity Tolerance: Patient tolerated treatment well Patient left: Other (comment) (with PT ambulating from EOB to recliner.)  OT Visit Diagnosis: Other abnormalities of gait and mobility (R26.89);Muscle weakness (generalized) (M62.81);History of falling (Z91.81)                Time: [id number] OT Time Calculation (min): 20 min Charges:  OT General Charges $OT Visit: 1 Visit OT Evaluation $OT Eval Moderate Complexity: 1 Mod OT Treatments $Self Care/Home Management : 8-22 mins  Ardeth Perfect., MPH, MS, OTR/L ascom [phone] 08/20/21, 1:01 PM

## 2021-08-20 NOTE — Progress Notes (Signed)
[place] NOTE       Patient ID: Brandi Aguirre MRN: [id number] DOB/AGE: 11/20/1921 88 y.o.  Admit date: 08/19/2021 Referring Physician Dr. Nolberto Hanlon  Primary Physician Dr. Johny Drilling Primary Cardiologist Dr. Ubaldo Glassing (last seen in 2018)  Reason for Consultation elevated troponin, AoCHF  HPI: Brandi Aguirre is a 88yoF who is very hard of hearing with a PMH of  paroxysmal atrial fibrillation on warfarin, SSS s/p St. 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  Cardiology is consulted for further assistance.  Interval History: -seen sitting up in a recliner, able to communicate via writing on whiteboard -She still feels short of breath and dizzy when she stands up.  Denies chest pain.  -Echo performed yesterday afternoon shows preserved LVEF 60-65% without regional WMA's, mild LVH, mild left atrial dilation, trivial MR, moderate TR with aortic sclerosis without evidence of stenosis  Review of systems unable to assess completely due to patient's hearing difficulties.   Past Medical History:  Diagnosis Date   Atrial fibrillation [facility])    Cochlear implant in place    Hypertension    Presence of permanent cardiac pacemaker    Sick sinus syndrome ([place])    Wears dentures     Past Surgical History:  Procedure Laterality Date   ABDOMINAL HYSTERECTOMY     COCHLEAR IMPLANT     ECTROPION REPAIR Right 01/16/2021   Procedure: ECTROPION REPAIR, TARSAL WEDGE ECTROPION REPAIR, EXTENSIVE RIGHT;  Surgeon: Karle Starch, MD;  Location: [facility];  Service: Ophthalmology;  Laterality: Right;   PACEMAKER INSERTION      Medications Prior to Admission  Medication Sig Dispense Refill Last Dose   hydrALAZINE (APRESOLINE) 10 MG tablet Take 1 tablet (10 mg total)  by mouth 3 (three) times daily. 90 tablet 0 08/18/2021   isosorbide mononitrate (IMDUR) 60 MG 24 hr tablet Take 60 mg by mouth daily.   0 08/18/2021   lisinopril (PRINIVIL,ZESTRIL) 2.5 MG tablet Take 1 tablet (2.5 mg total) by mouth daily. 30 tablet 0 08/18/2021   metoprolol (LOPRESSOR) 50 MG tablet Take 1 tablet (50 mg total) by mouth 2 (two) times daily. 60 tablet 0 08/18/2021   traZODone (DESYREL) 100 MG tablet Take 100 mg by mouth at bedtime.   0 08/18/2021   warfarin (COUMADIN) 5 MG tablet Take 5 mg by mouth daily.   08/13/2021 at 0800   warfarin (COUMADIN) 6 MG tablet Take 1 tablet (6 mg total) by mouth daily at 6 PM. (Patient taking differently: Take 5 mg by mouth daily at 6 PM.) 30 tablet 0 08/18/2021 at 0800   diltiazem (CARDIZEM) 120 MG tablet Take 120 mg by mouth 2 (two) times daily.  1 Unknown   erythromycin ophthalmic ointment Apply to sutures 4 times a day for 10-12 days.  Discontinue if allergy develops and call our office (Patient not taking: Reported on 07/20/2021) 3.5 g 2 Unknown   furosemide (LASIX) 20 MG tablet Take 1 tablet (20 mg total) by mouth daily. (Patient not taking: Reported on 07/20/2021) 30 tablet 0 Unknown    Social History   Socioeconomic History   Marital status: Widowed    Spouse name: Not on file   Number of children: Not on file   Years of education: Not on file   Highest  education level: Not on file  Occupational History   Not on file  Tobacco Use   Smoking status: Former    Types: Cigarettes   Smokeless tobacco: Never   Tobacco comments:    Quit smoking over 50 yrs ago  Vaping Use   Vaping Use: Never used  Substance and Sexual Activity   Alcohol use: Not Currently   Drug use: Not on file   Sexual activity: Not on file  Other Topics Concern   Not on file  Social History Narrative   Not on file   Social Determinants of Health   Financial Resource Strain: Not on file  Food Insecurity: Not on file  Transportation Needs: Not on file  Physical  Activity: Not on file  Stress: Not on file  Social Connections: Not on file  Intimate Partner Violence: Not on file    History reviewed. No pertinent family history.    PHYSICAL EXAM General: Elderly, frail and ill-appearing Caucasian female in no acute distress.  Sitting upright in recliner without family at bedside HEENT:  Normocephalic and atraumatic. Neck:  No JVD.  Lungs: Normal respiratory effort on 3L.  Clear to auscultation without appreciable wheezes  heart: Regular rate and rhythm normal S1 and S2 without gallops or murmurs.  Abdomen: Non-distended appearing.  Msk: Normal strength and tone for age. Extremities: Warm and well perfused. No clubbing, cyanosis. No peripheral edema.  Neuro: Alert and oriented, able to read questions written on the white board and respond verbally Psych: Calm and comfortable answers questions appropriately.  Labs:   Lab Results  Component Value Date   WBC 6.3 08/20/2021   HGB 11.9 (L) 08/20/2021   HCT 37.8 08/20/2021   MCV 95.5 08/20/2021   PLT 118 (L) 08/20/2021    Recent Labs  Lab 08/19/21 0813 08/20/21 0510  NA 140 141  K 4.1 3.8  CL 104 105  CO2 27 28  BUN 16 23  CREATININE 0.95 1.14*  CALCIUM 8.8* 8.4*  PROT 6.7  --   BILITOT 0.8  --   ALKPHOS 68  --   ALT 10  --   AST 19  --   GLUCOSE 140* 86    Lab Results  Component Value Date   CKMB 0.6 05/07/2013   TROPONINI <0.03 10/22/2015    No results found for: CHOL No results found for: HDL No results found for: LDLCALC No results found for: TRIG No results found for: CHOLHDL No results found for: LDLDIRECT    Radiology: DG Chest Portable 1 View  Result Date: 08/19/2021 CLINICAL DATA:  Shortness of breath, hypoxemia, cough, atrial fibrillation EXAM: PORTABLE CHEST 1 VIEW COMPARISON:  Portable exam 0820 hours compared to 07/20/2021 FINDINGS: LEFT subclavian pacemaker leads project over RIGHT atrium and RIGHT ventricle. Upper normal heart size with pulmonary vascular  congestion. Atherosclerotic calcification aorta. Scattered interstitial infiltrates bilaterally, favor pulmonary edema. No pleural effusion or pneumothorax. Bones demineralized. IMPRESSION: Pulmonary vascular congestion with new interstitial infiltrates question pulmonary edema. Aortic Atherosclerosis (ICD10-I70.0). Electronically Signed   By: Lavonia Dana M.D.   On: 08/19/2021 08:28   ECHOCARDIOGRAM COMPLETE  Result Date: 08/19/2021    ECHOCARDIOGRAM REPORT   Patient Name:   Brandi Aguirre Date of Exam: 08/19/2021 Medical Rec #:  [id number]      Height:       65.0 in Accession #:    [id number]     Weight:       134.5 lb Date of Birth:  February 03, 1922  BSA:          1.671 m Patient Age:    83 years       BP:           92/45 mmHg Patient Gender: F              HR:           83 bpm. Exam Location:  [facility] Procedure: 2D Echo, Color Doppler and Cardiac Doppler Indications:     I50.21 congestive heart failure-Acute Systolic  History:         Patient has prior history of Echocardiogram examinations.                  Pacemaker, Arrythmias:Atrial Fibrillation; Risk                  Factors:Hypertension.  Sonographer:     Charmayne Sheer Referring Phys:  [id number] [facility] Diagnosing Phys: Donnelly Angelica IMPRESSIONS  1. Left ventricular ejection fraction, by estimation, is 60 to 65%. The left ventricle has normal function. The left ventricle has no regional wall motion abnormalities. There is mild left ventricular hypertrophy. Left ventricular diastolic parameters are indeterminate.  2. Right ventricular systolic function is normal. The right ventricular size is normal.  3. Left atrial size was mildly dilated.  4. The mitral valve is degenerative. Trivial mitral valve regurgitation. No evidence of mitral stenosis.  5. Tricuspid valve regurgitation is moderate.  6. The aortic valve is normal in structure. Aortic valve regurgitation is not visualized. Aortic valve sclerosis is present, with no evidence of aortic valve stenosis.  FINDINGS  Left Ventricle: Left ventricular ejection fraction, by estimation, is 60 to 65%. The left ventricle has normal function. The left ventricle has no regional wall motion abnormalities. The left ventricular internal cavity size was normal in size. There is  mild left ventricular hypertrophy. Left ventricular diastolic parameters are indeterminate. Right Ventricle: The right ventricular size is normal. Right vetricular wall thickness was not well visualized. Right ventricular systolic function is normal. Left Atrium: Left atrial size was mildly dilated. Right Atrium: Right atrial size was normal in size. Pericardium: There is no evidence of pericardial effusion. Mitral Valve: The mitral valve is degenerative in appearance. Trivial mitral valve regurgitation. No evidence of mitral valve stenosis. MV peak gradient, 6.4 mmHg. The mean mitral valve gradient is 2.0 mmHg. Tricuspid Valve: The tricuspid valve is normal in structure. Tricuspid valve regurgitation is moderate. Aortic Valve: The aortic valve is normal in structure. Aortic valve regurgitation is not visualized. Aortic valve sclerosis is present, with no evidence of aortic valve stenosis. Aortic valve mean gradient measures 4.0 mmHg. Aortic valve peak gradient measures 7.2 mmHg. Aortic valve area, by VTI measures 1.49 cm. Pulmonic Valve: The pulmonic valve was not well visualized. Aorta: The aortic root is normal in size and structure. Venous: The inferior vena cava was not well visualized. IAS/Shunts: No atrial level shunt detected by color flow Doppler. Additional Comments: A device lead is visualized.  LEFT VENTRICLE PLAX 2D LVIDd:         3.66 cm   Diastology LVIDs:         2.68 cm   LV e' medial:    6.42 cm/s LV PW:         1.15 cm   LV E/e' medial:  18.2 LV IVS:        0.83 cm   LV e' lateral:   5.55 cm/s LVOT diam:  2.10 cm   LV E/e' lateral: 21.1 LV SV:         29 LV SV Index:   18 LVOT Area:     3.46 cm  RIGHT VENTRICLE RV Basal diam:  2.77  cm LEFT ATRIUM             Index        RIGHT ATRIUM          Index LA diam:        3.90 cm 2.33 cm/m   RA Area:     7.90 cm LA Vol (A2C):   81.5 ml 48.77 ml/m  RA Volume:   11.80 ml 7.06 ml/m LA Vol (A4C):   45.0 ml 26.93 ml/m LA Biplane Vol: 66.6 ml 39.85 ml/m  AORTIC VALVE                    PULMONIC VALVE AV Area (Vmax):    1.57 cm     PV Vmax:       0.93 m/s AV Area (Vmean):   1.48 cm     PV Vmean:      65.800 cm/s AV Area (VTI):     1.49 cm     PV VTI:        0.153 m AV Vmax:           134.00 cm/s  PV Peak grad:  3.5 mmHg AV Vmean:          89.800 cm/s  PV Mean grad:  2.0 mmHg AV VTI:            0.197 m AV Peak Grad:      7.2 mmHg AV Mean Grad:      4.0 mmHg LVOT Vmax:         60.70 cm/s LVOT Vmean:        38.400 cm/s LVOT VTI:          0.085 m LVOT/AV VTI ratio: 0.43  AORTA Ao Root diam: 3.50 cm MITRAL VALVE                TRICUSPID VALVE MV Area (PHT): 3.36 cm     TR Peak grad:   30.7 mmHg MV Area VTI:   1.07 cm     TR Vmax:        277.00 cm/s MV Peak grad:  6.4 mmHg MV Mean grad:  2.0 mmHg     SHUNTS MV Vmax:       1.26 m/s     Systemic VTI:  0.08 m MV Vmean:      68.1 cm/s    Systemic Diam: 2.10 cm MV Decel Time: 226 msec MV E velocity: 117.00 cm/s Donnelly Angelica Electronically signed by Donnelly Angelica Signature Date/Time: 08/19/2021/4:56:02 PM    Final     ECHO 09/04/2015 LV EF: 45% -   50%   -------------------------------------------------------------------  Indications:      CHF - 428.0.   -------------------------------------------------------------------  History:   PMH:   Atrial fibrillation.  Congestive heart failure.  Risk factors:  Hypertension. Dyslipidemia.   -------------------------------------------------------------------  Study Conclusions   - Left ventricle: Wall thickness was increased in a pattern of mild    LVH. Systolic function was mildly reduced. The estimated ejection    fraction was in the range of 45% to 50%. Akinesis of the    anteroseptal myocardium.  Akinesis of the apical myocardium.    Akinesis of the anteroseptal myocardium.  - Left atrium: The  atrium was mildly dilated.  - Tricuspid valve: There was moderate regurgitation.   TELEMETRY reviewed by me: atrial fibrillation with frequent PVCs and short <10 beat runs of VT  EKG reviewed by me: sinus rhythm with old anteroseptal infarct rate 67  ASSESSMENT AND PLAN:  Brandi Aguirre is a 88yoF who is very hard of hearing with a PMH of  paroxysmal atrial fibrillation on warfarin, SSS s/p St. 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  Cardiology is consulted for further assistance.  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  History is limited due to patient's extreme difficulty hearing she is able to say she felt short of breath and does not have chest pain. Reportedly not taking lasix anymore. Her BNP is elevated to 600 and troponin is uptrending from 99-914, EKG is without acute ischemic changes.  Chest x-ray with evidence of vascular congestion suggesting volume overload.  She had a similar admission in 2017 with volume overload and elevated troponin in the setting and was managed conservatively.  She is notably a DNR and has not been seen by her outpatient cardiologist in 5 years. -s/p 325mg  aspirin, continue aspirin 81mg  daily with monitoring of her platelets (118 today)  -defer heparin drip, continue warfarin as below -continue metoprolol XL at lower dose 12.5mg  (on 50mg  at home)  -s/p lasix 40mg  x2 , continue gentle diuresis with IV lasix 20 mg once daily, consider switching to p.o. dosing tomorrow after recheck of BMP -Trend troponin until  peak  -agree with hydralazine 10mg  TID for hypertension -Hold lisinopril 2.5mg  due to AKI -echocardiogram complete resulted with preserved ejection fraction without regional WMA's -agree with palliative care input for [place] discussion with family, recommend conservative cardiology input for now  #SSS s/p St Jude DC PPM  #paroxysmal AF on warfarin -Back in sinus rhythm to sinus bradycardia with rate in the 50s-60s -continue metoprolol and warfarin dosing per pharmacy   This patient's plan of care was discussed and created with Dr. Donnelly Angelica and he is in agreement.  Signed: Tristan Schroeder , PA-C 08/20/2021, 11:43 AM [facility]

## 2021-08-20 NOTE — Progress Notes (Signed)
     Referral received for Brandi Aguirre for goals of care discussion. Chart reviewed and updates received from RN.  I attempted to see the patient but she was sleeping soundly and given that she is "basically deaf" (per her daughter-in-law) I elected to not wake her.   I called her daughter-in-law Brandi Aguirre and had a good discussion. See initial recommendations below.  Full consult note to follow.   At the end of the discussion we planned a family meeting for tomorrow at 10:00 AM in the patient's room.    Summary of Recommendations: Remain DNR Continue to treat the treatable for now Planned family meeting tomorrow morning PMT will continue to follow    Thank you for your referral and allowing PMT to assist in [name]'s care.   Wynne Dust, NP Palliative Medicine Team Phone: [phone]  NO CHARGE

## 2021-08-20 NOTE — [place] FL2 (Signed)
[place] MEDICAID FL2 LEVEL OF CARE SCREENING TOOL     IDENTIFICATION  Patient Name: Brandi Aguirre Birthdate: 02-28-1922 Sex: female Admission Date (Current Location): 08/19/2021  [facility] and [place] Number:  Chiropodist and Address:  [facility], [address] [place], [place] [address]      Provider Number: [id number]  Attending Physician Name and Address:  Lynn Ito, MD  Relative Name and Phone Number:  Darl Pikes (daughter) [phone]    Current Level of Care: Hospital Recommended Level of Care: Skilled Nursing Facility Prior Approval Number:    Date Approved/Denied:   PASRR Number: [id number]  Discharge Plan: SNF    Current Diagnoses: Patient Active Problem List   Diagnosis Date Noted   Acute on chronic systolic CHF (congestive heart failure) (HCC) 08/19/2021   Leukocytosis 08/19/2021   Elevated troponin 08/19/2021   Essential hypertension 08/19/2021   Acute respiratory failure with hypoxia (HCC) 08/19/2021   PAF (paroxysmal atrial fibrillation) (HCC) 08/19/2021   Generalized weakness 07/20/2021   Fall at home, initial encounter 07/20/2021   CHF (congestive heart failure) (HCC) 09/04/2015   HTN (hypertension) 09/04/2015   Atrial fibrillation (HCC) 09/04/2015   Dyslipidemia 09/04/2015   HOH (hard of hearing) 09/04/2015    Orientation RESPIRATION BLADDER Height & Weight     Self, Time, Situation, Place  O2 (2L oxygen) Continent Weight: 121 lb 4.1 oz (55 kg) Height:  5\' 5"  (165.1 cm)  BEHAVIORAL SYMPTOMS/MOOD NEUROLOGICAL BOWEL NUTRITION STATUS      Continent Diet (see discharge summary)  AMBULATORY STATUS COMMUNICATION OF NEEDS Skin   Limited Assist Verbally Normal                       Personal Care Assistance Level of Assistance  Bathing, Feeding, Total care, Dressing Bathing Assistance: Limited assistance Feeding assistance: Independent Dressing Assistance: Limited assistance Total Care Assistance:  Limited assistance   Functional Limitations Info  Sight, Speech, Hearing Sight Info: Adequate Hearing Info: Impaired Speech Info: Adequate    SPECIAL CARE FACTORS FREQUENCY  PT (By licensed PT), OT (By licensed OT)     PT Frequency: min 4x weekly OT Frequency: min 4x weekly            Contractures Contractures Info: Not present    Additional Factors Info  Code Status, Allergies Code Status Info: DNR Allergies Info: sulfa antibiotics           Current Medications (08/20/2021):  This is the current hospital active medication list Current Facility-Administered Medications  Medication Dose Route Frequency Provider Last Rate Last Admin   0.9 %  sodium chloride infusion  250 mL Intravenous PRN 08/22/2021, MD       acetaminophen (TYLENOL) tablet 650 mg  650 mg Oral Q4H PRN Lynn Ito, MD       aspirin chewable tablet 81 mg  81 mg Oral Daily Lynn Ito, PA-C   81 mg at 08/20/21 08/22/21   furosemide (LASIX) injection 20 mg  20 mg Intravenous Daily [id number], MD   20 mg at 08/20/21 0834   hydrALAZINE (APRESOLINE) tablet 10 mg  10 mg Oral TID 08/22/21, MD   10 mg at 08/20/21 0834   metoprolol succinate (TOPROL-XL) 24 hr tablet 12.5 mg  12.5 mg Oral Daily 08/22/21, PA-C   12.5 mg at 08/20/21 08/22/21   nitroGLYCERIN (NITROSTAT) SL tablet 0.4 mg  0.4 mg Sublingual Q5 min PRN 0350, MD   0.4 mg  at 08/19/21 0925   ondansetron (ZOFRAN) injection 4 mg  4 mg Intravenous Q6H PRN Lynn Ito, MD       sodium chloride flush (NS) 0.9 % injection 3 mL  3 mL Intravenous Q12H Lynn Ito, MD   3 mL at 08/20/21 0835   sodium chloride flush (NS) 0.9 % injection 3 mL  3 mL Intravenous PRN Lynn Ito, MD       traZODone (DESYREL) tablet 100 mg  100 mg Oral QHS Lynn Ito, MD   100 mg at 08/19/21 2113   warfarin (COUMADIN) tablet 3 mg  3 mg Oral Once per day on Sun Mon Tue Wed Fri Sat Foye Deer, RPH   3 mg at 08/19/21 1805   warfarin (COUMADIN) tablet 5 mg  5  mg Oral Once per day on Thu Kluttz, Lisa G, [facility]       Warfarin - Pharmacist Dosing Inpatient   Does not apply [id number] Foye Deer, [facility]         Discharge Medications: Please see discharge summary for a list of discharge medications.  Relevant Imaging Results:  Relevant Lab Results:   Additional Information SSN:[id number]  Gildardo Griffes, LCSW

## 2021-08-20 NOTE — Progress Notes (Signed)
PROGRESS NOTE    Brandi Aguirre  EXB:[id number] DOB: [birth year] DOA: 08/19/2021 PCP: Dione Housekeeper, MD    Brief Narrative:  Brandi Aguirre is a 88 y.o. female with medical history significant of history of A-fib on coumadin, hypertension , hard of hearing, PPM placement was brought by EMS after she called her daughter in law this am stating she is sob and cold Patient found in heart failure   PT OT recommended SNF  Consultants:  Cardiology  Procedures:   Antimicrobials:      Subjective: Shortness of breath is decreasing feels better.  Objective: Vitals:   08/19/21 2319 08/20/21 0259 08/20/21 0500 08/20/21 0723  BP: (!) 157/64 (!) 173/56  (!) 173/63  Pulse: 60 60  70  Resp: 18 20  17   Temp: 97.8 F (36.6 C) 97.7 F (36.5 C)  97.7 F (36.5 C)  TempSrc:  Oral    SpO2: 95% 95%  91%  Weight:   55 kg   Height:       No intake or output data in the 24 hours ending 08/20/21 0844 Filed Weights   08/19/21 0807 08/20/21 0500  Weight: 61 kg 55 kg    Examination: Calm, NAD Decreased breath sounds, no wheezing Reg s1/s2 no gallop Soft benign +bs No edema Awake and alert Mood and affect appropriate in current setting     Data Reviewed: I have personally reviewed following labs and imaging studies  CBC: Recent Labs  Lab 08/19/21 0813  WBC 12.4*  HGB 13.6  HCT 43.3  MCV 95.2  PLT 136*   Basic Metabolic Panel: Recent Labs  Lab 08/19/21 0813 08/20/21 0510  NA 140 141  K 4.1 3.8  CL 104 105  CO2 27 28  GLUCOSE 140* 86  BUN 16 23  CREATININE 0.95 1.14*  CALCIUM 8.8* 8.4*   GFR: Estimated Creatinine Clearance: 23.9 mL/min (A) (by C-G formula based on SCr of 1.14 mg/dL (H)). Liver Function Tests: Recent Labs  Lab 08/19/21 0813  AST 19  ALT 10  ALKPHOS 68  BILITOT 0.8  PROT 6.7  ALBUMIN 3.5   No results for input(s): LIPASE, AMYLASE in the last 168 hours. No results for input(s): AMMONIA in the last 168 hours. Coagulation  Profile: Recent Labs  Lab 08/19/21 0813 08/20/21 0510  INR 2.1* 1.6*   Cardiac Enzymes: No results for input(s): CKTOTAL, CKMB, CKMBINDEX, TROPONINI in the last 168 hours. BNP (last 3 results) No results for input(s): PROBNP in the last 8760 hours. HbA1C: No results for input(s): HGBA1C in the last 72 hours. CBG: No results for input(s): GLUCAP in the last 168 hours. Lipid Profile: No results for input(s): CHOL, HDL, LDLCALC, TRIG, CHOLHDL, LDLDIRECT in the last 72 hours. Thyroid Function Tests: No results for input(s): TSH, T4TOTAL, FREET4, T3FREE, THYROIDAB in the last 72 hours. Anemia Panel: No results for input(s): VITAMINB12, FOLATE, FERRITIN, TIBC, IRON, RETICCTPCT in the last 72 hours. Sepsis Labs: No results for input(s): PROCALCITON, LATICACIDVEN in the last 168 hours.  No results found for this or any previous visit (from the past 240 hour(s)).       Radiology Studies: DG Chest Portable 1 View  Result Date: 08/19/2021 CLINICAL DATA:  Shortness of breath, hypoxemia, cough, atrial fibrillation EXAM: PORTABLE CHEST 1 VIEW COMPARISON:  Portable exam 0820 hours compared to 07/20/2021 FINDINGS: LEFT subclavian pacemaker leads project over RIGHT atrium and RIGHT ventricle. Upper normal heart size with pulmonary vascular congestion. Atherosclerotic calcification aorta. Scattered interstitial infiltrates bilaterally, favor  pulmonary edema. No pleural effusion or pneumothorax. Bones demineralized. IMPRESSION: Pulmonary vascular congestion with new interstitial infiltrates question pulmonary edema. Aortic Atherosclerosis (ICD10-I70.0). Electronically Signed   By: Ulyses Southward M.D.   On: 08/19/2021 08:28   ECHOCARDIOGRAM COMPLETE  Result Date: 08/19/2021    ECHOCARDIOGRAM REPORT   Patient Name:   Brandi Aguirre Date of Exam: 08/19/2021 Medical Rec #:  [id number]      Height:       65.0 in Accession #:    [id number]     Weight:       134.5 lb Date of Birth:  [birth year]      BSA:           1.671 m Patient Age:    89 years       BP:           92/45 mmHg Patient Gender: F              HR:           83 bpm. Exam Location:  [facility] Procedure: 2D Echo, Color Doppler and Cardiac Doppler Indications:     I50.21 congestive heart failure-Acute Systolic  History:         Patient has prior history of Echocardiogram examinations.                  Pacemaker, Arrythmias:Atrial Fibrillation; Risk                  Factors:Hypertension.  Sonographer:     Humphrey Rolls Referring Phys:  [id number] [facility] Johannes Everage Diagnosing Phys: Sena Slate IMPRESSIONS  1. Left ventricular ejection fraction, by estimation, is 60 to 65%. The left ventricle has normal function. The left ventricle has no regional wall motion abnormalities. There is mild left ventricular hypertrophy. Left ventricular diastolic parameters are indeterminate.  2. Right ventricular systolic function is normal. The right ventricular size is normal.  3. Left atrial size was mildly dilated.  4. The mitral valve is degenerative. Trivial mitral valve regurgitation. No evidence of mitral stenosis.  5. Tricuspid valve regurgitation is moderate.  6. The aortic valve is normal in structure. Aortic valve regurgitation is not visualized. Aortic valve sclerosis is present, with no evidence of aortic valve stenosis. FINDINGS  Left Ventricle: Left ventricular ejection fraction, by estimation, is 60 to 65%. The left ventricle has normal function. The left ventricle has no regional wall motion abnormalities. The left ventricular internal cavity size was normal in size. There is  mild left ventricular hypertrophy. Left ventricular diastolic parameters are indeterminate. Right Ventricle: The right ventricular size is normal. Right vetricular wall thickness was not well visualized. Right ventricular systolic function is normal. Left Atrium: Left atrial size was mildly dilated. Right Atrium: Right atrial size was normal in size. Pericardium: There is no evidence of pericardial effusion.  Mitral Valve: The mitral valve is degenerative in appearance. Trivial mitral valve regurgitation. No evidence of mitral valve stenosis. MV peak gradient, 6.4 mmHg. The mean mitral valve gradient is 2.0 mmHg. Tricuspid Valve: The tricuspid valve is normal in structure. Tricuspid valve regurgitation is moderate. Aortic Valve: The aortic valve is normal in structure. Aortic valve regurgitation is not visualized. Aortic valve sclerosis is present, with no evidence of aortic valve stenosis. Aortic valve mean gradient measures 4.0 mmHg. Aortic valve peak gradient measures 7.2 mmHg. Aortic valve area, by VTI measures 1.49 cm. Pulmonic Valve: The pulmonic valve was not well visualized. Aorta: The aortic root is normal in size and structure.  Venous: The inferior vena cava was not well visualized. IAS/Shunts: No atrial level shunt detected by color flow Doppler. Additional Comments: A device lead is visualized.  LEFT VENTRICLE PLAX 2D LVIDd:         3.66 cm   Diastology LVIDs:         2.68 cm   LV e' medial:    6.42 cm/s LV PW:         1.15 cm   LV E/e' medial:  18.2 LV IVS:        0.83 cm   LV e' lateral:   5.55 cm/s LVOT diam:     2.10 cm   LV E/e' lateral: 21.1 LV SV:         29 LV SV Index:   18 LVOT Area:     3.46 cm  RIGHT VENTRICLE RV Basal diam:  2.77 cm LEFT ATRIUM             Index        RIGHT ATRIUM          Index LA diam:        3.90 cm 2.33 cm/m   RA Area:     7.90 cm LA Vol (A2C):   81.5 ml 48.77 ml/m  RA Volume:   11.80 ml 7.06 ml/m LA Vol (A4C):   45.0 ml 26.93 ml/m LA Biplane Vol: 66.6 ml 39.85 ml/m  AORTIC VALVE                    PULMONIC VALVE AV Area (Vmax):    1.57 cm     PV Vmax:       0.93 m/s AV Area (Vmean):   1.48 cm     PV Vmean:      65.800 cm/s AV Area (VTI):     1.49 cm     PV VTI:        0.153 m AV Vmax:           134.00 cm/s  PV Peak grad:  3.5 mmHg AV Vmean:          89.800 cm/s  PV Mean grad:  2.0 mmHg AV VTI:            0.197 m AV Peak Grad:      7.2 mmHg AV Mean Grad:      4.0  mmHg LVOT Vmax:         60.70 cm/s LVOT Vmean:        38.400 cm/s LVOT VTI:          0.085 m LVOT/AV VTI ratio: 0.43  AORTA Ao Root diam: 3.50 cm MITRAL VALVE                TRICUSPID VALVE MV Area (PHT): 3.36 cm     TR Peak grad:   30.7 mmHg MV Area VTI:   1.07 cm     TR Vmax:        277.00 cm/s MV Peak grad:  6.4 mmHg MV Mean grad:  2.0 mmHg     SHUNTS MV Vmax:       1.26 m/s     Systemic VTI:  0.08 m MV Vmean:      68.1 cm/s    Systemic Diam: 2.10 cm MV Decel Time: 226 msec MV E velocity: 117.00 cm/s Sena Slateyan Orgel Electronically signed by Sena Slateyan Orgel Signature Date/Time: 08/19/2021/4:56:02 PM    Final  Scheduled Meds:  aspirin  81 mg Oral Daily   furosemide  20 mg Intravenous Daily   hydrALAZINE  10 mg Oral TID   metoprolol succinate  12.5 mg Oral Daily   sodium chloride flush  3 mL Intravenous Q12H   traZODone  100 mg Oral QHS   warfarin  3 mg Oral Once per day on Sun Mon Tue Wed Fri Sat   warfarin  5 mg Oral Once per day on Thu   Warfarin - Pharmacist Dosing Inpatient   Does not apply q1600   Continuous Infusions:  sodium chloride      Assessment & Plan:   Principal Problem:   CHF (congestive heart failure) (HCC) Active Problems:   HOH (hard of hearing)   Acute on chronic systolic CHF (congestive heart failure) (HCC)   Leukocytosis   Elevated troponin   Essential hypertension   Acute respiratory failure with hypoxia (HCC)   PAF (paroxysmal atrial fibrillation) (HCC)   Acute respiratory failure with hypoxia Presented with shortness of breath and hypoxia to 87% on room air  2/2 acute systolic heart failure and volume overload BNP elevated Echo from 2017 EF 45 to 50% Echo here nml EF, diastolic function indeterminant 5/25 clinically improving Continue Lasix 20 mg IV monitor creatinine and electrolytes        2.  Acute on chronic systolic heart failure Echo as above Continue Lasix as above Clinically improving I's and O's and daily weight      3. Elevated  TP Likely due to demand ischemia from above Echo today with normal EF and no regional wall motion abnormality Cardiology following Was given asa 325mg , then 81mg  therafter. On  beta-blockers 5/25 troponin trending down No chest pain      4.PAF S/PPPM NSVT Pharmacy consulted for coumadin mx On beta blk  Echo nml ef and no wma 5/25 continue Coumadin INR 1.6 pharmacy to readjust dosing   5. Essential HTN Stable.  Continue current management.       DVT prophylaxis: On Coumadin Code Status: DNR Family Communication: None at bedside Disposition Plan: SNF pending Status is: Inpatient Remains inpatient appropriate because: IV treatment      LOS: 1 day   Time spent: 35 min    6/25, MD [facility] Pager [phone]  If 7PM-7AM, please contact night-coverage 08/20/2021, 8:44 AM

## 2021-08-20 NOTE — Progress Notes (Signed)
Nutrition Brief Note  RD pulled to chart secondary to admission with CHF.   Wt Readings from Last 15 Encounters:  08/20/21 55 kg  07/20/21 61.2 kg  01/16/21 59.9 kg  10/22/15 49.9 kg  09/07/15 50.7 kg   Pt with PMH of  paroxysmal atrial fibrillation on warfarin, SSS s/p 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  Pt admitted with CHF.   Pt sitting in recliner chair at time daughter in law at bedside. Pt very frail and thin appearing. Per chart review, pt is very hard of hearing. Pt is awaiting palliative care consult for goals of care discussions. Family is considering hospice. RD deferred education and assessment at this time.   Reviewed wt hx; pt has experienced a 10.1% wt loss over the past month, which is significant for time frame.   Highly suspect pt with malnutrition, however, unable to identify at this time. RD will liberalize diet to 2 gram sodium for wider variety of meal selections.   Medications reviewed.   Labs reviewed.   Current diet order is Heart Healthy, patient is consuming approximately n/a% of meals at this time. Labs and medications reviewed.   No nutrition interventions warranted at this time. If nutrition issues arise, please consult RD.   Levada Schilling, RD, LDN, CDCES Registered Dietitian II Certified Diabetes Care and Education Specialist Please refer to [facility] for RD and/or RD on-call/weekend/after hours pager

## 2021-08-20 NOTE — TOC Initial Note (Signed)
Transition of Care [facility]) - Initial/Assessment Note    Patient Details  Name: Brandi Aguirre MRN: [id number] Date of Birth: 1922/01/04  [facility] of [facility]) CM/SW Contact:    Alberteen Sam, LCSW Phone Number: 08/20/2021, 11:45 AM  Clinical Narrative:                  CSW met with patient and daughter in law at bedside. Patient has almost no hearing per daughter in law, CSW used dry erase board to communicate with patient.   Patient recently at [facility] reports would like to return there for short term rehab.   CSW has sent referral, pending bed offer.   Expected Discharge Plan: Skilled Nursing Facility Barriers to Discharge: Continued Medical Work up   Patient Goals and CMS Choice Patient states their goals for this hospitalization and ongoing recovery are:: to go home CMS Medicare.gov Compare Post Acute Care list provided to:: Patient Choice offered to / list presented to : Patient  Expected Discharge Plan and Services Expected Discharge Plan: [place]                                              Prior Living Arrangements/Services   Lives with:: Self                   Activities of Daily Living Home Assistive Devices/Equipment: Environmental consultant (specify type), Eyeglasses ADL Screening (condition at time of admission) Patient's cognitive ability adequate to safely complete daily activities?: Yes Is the patient deaf or have difficulty hearing?: No Does the patient have difficulty seeing, even when wearing glasses/contacts?: No Does the patient have difficulty concentrating, remembering, or making decisions?: No Patient able to express need for assistance with ADLs?: Yes Does the patient have difficulty dressing or bathing?: No Independently performs ADLs?: No Communication: Independent Dressing (OT): Independent Grooming: Independent Feeding: Independent Bathing: Independent Toileting: Independent In/Out Bed: Needs assistance Is  this a change from baseline?: Change from baseline, expected to last <3 days Walks in Home: Needs assistance Is this a change from baseline?: Change from baseline, expected to last <3 days Does the patient have difficulty walking or climbing stairs?: Yes Weakness of Legs: Both Weakness of Arms/Hands: None  Permission Sought/Granted                  Emotional Assessment       Orientation: : Oriented to Self, Oriented to Place, Oriented to  Time, Oriented to Situation Alcohol / Substance Use: Not Applicable Psych Involvement: No (comment)  Admission diagnosis:  CHF (congestive heart failure) ([place]) [I50.9] Acute respiratory failure with hypoxia ([place]) [J96.01] Acute on chronic congestive heart failure, unspecified heart failure type ([place]) [I50.9] Patient Active Problem List   Diagnosis Date Noted   Acute on chronic systolic CHF (congestive heart failure) ([place]) 08/19/2021   Leukocytosis 08/19/2021   Elevated troponin 08/19/2021   Essential hypertension 08/19/2021   Acute respiratory failure with hypoxia ([place]) 08/19/2021   PAF (paroxysmal atrial fibrillation) ([place]) 08/19/2021   Generalized weakness 07/20/2021   Fall at home, initial encounter 07/20/2021   CHF (congestive heart failure) ([place]) 09/04/2015   HTN (hypertension) 09/04/2015   Atrial fibrillation ([place]) 09/04/2015   Dyslipidemia 09/04/2015   HOH (hard of hearing) 09/04/2015   PCP:  Valera Castle, MD Pharmacy:   [facility], [place] -  [place] AT [facility] OF [address] [place] [address] [place] [address] Phone: [phone] Fax: [phone]     Social Determinants of Health (SDOH) Interventions    Readmission Risk Interventions     View : No data to display.

## 2021-08-20 NOTE — Progress Notes (Signed)
[facility] for continuation of Warfarin Indication: atrial fibrillation  Allergies  Allergen Reactions   Sulfa Antibiotics Rash    Patient Measurements: Height: 5\' 5"  (165.1 cm) Weight: 55 kg (121 lb 4.1 oz) IBW/kg (Calculated) : 57 Heparin Dosing Weight:    Vital Signs: Temp: 97.7 F (36.5 C) (05/25 0723) Temp Source: Oral (05/25 0259) BP: 173/63 (05/25 0723) Pulse Rate: 70 (05/25 0723)  Labs: Recent Labs    08/19/21 0813 08/19/21 1055 08/20/21 0510  HGB 13.6  --   --   HCT 43.3  --   --   PLT 136*  --   --   LABPROT 23.5*  --  19.0*  INR 2.1*  --  1.6*  CREATININE 0.95  --  1.14*  TROPONINIHS 99* 914*  --      Estimated Creatinine Clearance: 23.9 mL/min (A) (by C-G formula based on SCr of 1.14 mg/dL (H)).   Medical History: Past Medical History:  Diagnosis Date   Atrial fibrillation [facility])    Cochlear implant in place    Hypertension    Presence of permanent cardiac pacemaker    Sick sinus syndrome ([place])    Wears dentures     Medications:  PTA Warfarin: 5mg  on Thursday, 3mg  all other days  Assessment: Patient is a 88yo female with a history of afib. She is anticoagulated on warfarin. Pharmacy consulted to manage warfarin dosing. Per anticoagulation notes, pt's dose was increase 8-10% but notes not clear on what dose the patient was prescribed.   Date INR Warfarin Dose  5/24 2.1 3 mg  5/25 1.6 5 mg     Goal of Therapy:  INR 2-3 Monitor platelets by anticoagulation protocol: Yes   Plan:  INR is subtherapeutic. Will give warfarin 5 mg x 1 tonight x 1 (home dose). Daily INR. CBC at least every 3 days.   Eleonore Chiquito, PharmD, BCPS 08/20/2021 7:52 AM

## 2021-08-21 DIAGNOSIS — I10 Essential (primary) hypertension: Secondary | ICD-10-CM | POA: Diagnosis not present

## 2021-08-21 DIAGNOSIS — J9601 Acute respiratory failure with hypoxia: Secondary | ICD-10-CM | POA: Diagnosis not present

## 2021-08-21 DIAGNOSIS — Z7189 Other specified counseling: Secondary | ICD-10-CM

## 2021-08-21 DIAGNOSIS — Z515 Encounter for palliative care: Secondary | ICD-10-CM | POA: Diagnosis not present

## 2021-08-21 DIAGNOSIS — Z66 Do not resuscitate: Secondary | ICD-10-CM | POA: Diagnosis not present

## 2021-08-21 DIAGNOSIS — N179 Acute kidney failure, unspecified: Secondary | ICD-10-CM

## 2021-08-21 DIAGNOSIS — I509 Heart failure, unspecified: Secondary | ICD-10-CM

## 2021-08-21 DIAGNOSIS — R778 Other specified abnormalities of plasma proteins: Secondary | ICD-10-CM | POA: Diagnosis not present

## 2021-08-21 DIAGNOSIS — I5023 Acute on chronic systolic (congestive) heart failure: Secondary | ICD-10-CM | POA: Diagnosis not present

## 2021-08-21 LAB — BASIC METABOLIC PANEL
Anion gap: 7 (ref 5–15)
BUN: 27 mg/dL — ABNORMAL HIGH (ref 8–23)
CO2: 30 mmol/L (ref 22–32)
Calcium: 8.4 mg/dL — ABNORMAL LOW (ref 8.9–10.3)
Chloride: 104 mmol/L (ref 98–111)
Creatinine, Ser: 0.97 mg/dL (ref 0.44–1.00)
GFR, Estimated: 53 mL/min — ABNORMAL LOW (ref >60)
Glucose, Bld: 85 mg/dL (ref 70–99)
Potassium: 3.7 mmol/L (ref 3.5–5.1)
Sodium: 141 mmol/L (ref 135–145)

## 2021-08-21 LAB — PROTIME-INR
INR: 1.8 — ABNORMAL HIGH (ref 0.8–1.2)
Prothrombin Time: 20.5 seconds — ABNORMAL HIGH (ref 11.4–15.2)

## 2021-08-21 LAB — PLATELET COUNT: Platelets: 126 10*3/uL — ABNORMAL LOW (ref 150–400)

## 2021-08-21 MED ORDER — ENSURE ENLIVE PO LIQD
237.0000 mL | Freq: Three times a day (TID) | ORAL | Status: DC
  Administered 2021-08-21 – 2021-08-25 (×12): 237 mL via ORAL

## 2021-08-21 MED ORDER — ADULT MULTIVITAMIN W/MINERALS CH
1.0000 | ORAL_TABLET | Freq: Every day | ORAL | Status: DC
  Administered 2021-08-21 – 2021-08-25 (×5): 1 via ORAL
  Filled 2021-08-21 (×5): qty 1

## 2021-08-21 NOTE — Progress Notes (Addendum)
PROGRESS NOTE    Brandi PoagFrances Aguirre  WGN:[id number] DOB: 01/21/1922 DOA: 08/19/2021 PCP: Dione Housekeeperlmedo, Mario Ernesto, MD    Brief Narrative:  Brandi Aguirre is a 88 y.o. female with medical history significant of history of A-fib on coumadin, hypertension , hard of hearing, PPM placement was brought by EMS after she called her daughter in law this am stating she is sob and cold Patient found in heart failure   PT OT recommended SNF  5/26 feeling better, less sob. Eating breakfast. Palliative consulted, and meeting family  Consultants:  Cardiology  Procedures:   Antimicrobials:      Subjective: No chest pain or dizziness  Objective: Vitals:   08/21/21 0315 08/21/21 0429 08/21/21 0824 08/21/21 0830  BP: (!) 180/50 (!) 134/43  (!) 155/49  Pulse: 70   66  Resp:    18  Temp: 98.1 F (36.7 C)  98.3 F (36.8 C) 98.3 F (36.8 C)  TempSrc: Oral  Oral Oral  SpO2: 98%   97%  Weight:  52.9 kg    Height:        Intake/Output Summary (Last 24 hours) at 08/21/2021 1054 Last data filed at 08/21/2021 0800 Gross per 24 hour  Intake 123 ml  Output 1100 ml  Net -977 ml   Filed Weights   08/19/21 0807 08/20/21 0500 08/21/21 0429  Weight: 61 kg 55 kg 52.9 kg    Examination: Calm, NAD Decrease  bs, no wheezing Reg s1/s2 no gallop Soft benign +bs No edema Awake and alert Mood and affect appropriate in current setting     Data Reviewed: I have personally reviewed following labs and imaging studies  CBC: Recent Labs  Lab 08/19/21 0813 08/20/21 0510 08/21/21 0536  WBC 12.4* 6.3  --   HGB 13.6 11.9*  --   HCT 43.3 37.8  --   MCV 95.2 95.5  --   PLT 136* 118* 126*   Basic Metabolic Panel: Recent Labs  Lab 08/19/21 0813 08/20/21 0510 08/21/21 0536  NA 140 141 141  K 4.1 3.8 3.7  CL 104 105 104  CO2 27 28 30   GLUCOSE 140* 86 85  BUN 16 23 27*  CREATININE 0.95 1.14* 0.97  CALCIUM 8.8* 8.4* 8.4*   GFR: Estimated Creatinine Clearance: 27 mL/min (by C-G formula based  on SCr of 0.97 mg/dL). Liver Function Tests: Recent Labs  Lab 08/19/21 0813  AST 19  ALT 10  ALKPHOS 68  BILITOT 0.8  PROT 6.7  ALBUMIN 3.5   No results for input(s): LIPASE, AMYLASE in the last 168 hours. No results for input(s): AMMONIA in the last 168 hours. Coagulation Profile: Recent Labs  Lab 08/19/21 0813 08/20/21 0510 08/21/21 0536  INR 2.1* 1.6* 1.8*   Cardiac Enzymes: No results for input(s): CKTOTAL, CKMB, CKMBINDEX, TROPONINI in the last 168 hours. BNP (last 3 results) No results for input(s): PROBNP in the last 8760 hours. HbA1C: No results for input(s): HGBA1C in the last 72 hours. CBG: No results for input(s): GLUCAP in the last 168 hours. Lipid Profile: No results for input(s): CHOL, HDL, LDLCALC, TRIG, CHOLHDL, LDLDIRECT in the last 72 hours. Thyroid Function Tests: No results for input(s): TSH, T4TOTAL, FREET4, T3FREE, THYROIDAB in the last 72 hours. Anemia Panel: No results for input(s): VITAMINB12, FOLATE, FERRITIN, TIBC, IRON, RETICCTPCT in the last 72 hours. Sepsis Labs: No results for input(s): PROCALCITON, LATICACIDVEN in the last 168 hours.  No results found for this or any previous visit (from the past 240 hour(s)).  Radiology Studies: ECHOCARDIOGRAM COMPLETE  Result Date: 08/19/2021    ECHOCARDIOGRAM REPORT   Patient Name:   Brandi Aguirre Date of Exam: 08/19/2021 Medical Rec #:  [id number]      Height:       65.0 in Accession #:    [id number]     Weight:       134.5 lb Date of Birth:  [birth year]      BSA:          1.671 m Patient Age:    88 years       BP:           92/45 mmHg Patient Gender: F              HR:           83 bpm. Exam Location:  [facility] Procedure: 2D Echo, Color Doppler and Cardiac Doppler Indications:     I50.21 congestive heart failure-Acute Systolic  History:         Patient has prior history of Echocardiogram examinations.                  Pacemaker, Arrythmias:Atrial Fibrillation; Risk                   Factors:Hypertension.  Sonographer:     Humphrey Rolls Referring Phys:  [id number] [facility] Melissa Tomaselli Diagnosing Phys: Sena Slate IMPRESSIONS  1. Left ventricular ejection fraction, by estimation, is 60 to 65%. The left ventricle has normal function. The left ventricle has no regional wall motion abnormalities. There is mild left ventricular hypertrophy. Left ventricular diastolic parameters are indeterminate.  2. Right ventricular systolic function is normal. The right ventricular size is normal.  3. Left atrial size was mildly dilated.  4. The mitral valve is degenerative. Trivial mitral valve regurgitation. No evidence of mitral stenosis.  5. Tricuspid valve regurgitation is moderate.  6. The aortic valve is normal in structure. Aortic valve regurgitation is not visualized. Aortic valve sclerosis is present, with no evidence of aortic valve stenosis. FINDINGS  Left Ventricle: Left ventricular ejection fraction, by estimation, is 60 to 65%. The left ventricle has normal function. The left ventricle has no regional wall motion abnormalities. The left ventricular internal cavity size was normal in size. There is  mild left ventricular hypertrophy. Left ventricular diastolic parameters are indeterminate. Right Ventricle: The right ventricular size is normal. Right vetricular wall thickness was not well visualized. Right ventricular systolic function is normal. Left Atrium: Left atrial size was mildly dilated. Right Atrium: Right atrial size was normal in size. Pericardium: There is no evidence of pericardial effusion. Mitral Valve: The mitral valve is degenerative in appearance. Trivial mitral valve regurgitation. No evidence of mitral valve stenosis. MV peak gradient, 6.4 mmHg. The mean mitral valve gradient is 2.0 mmHg. Tricuspid Valve: The tricuspid valve is normal in structure. Tricuspid valve regurgitation is moderate. Aortic Valve: The aortic valve is normal in structure. Aortic valve regurgitation is not visualized. Aortic  valve sclerosis is present, with no evidence of aortic valve stenosis. Aortic valve mean gradient measures 4.0 mmHg. Aortic valve peak gradient measures 7.2 mmHg. Aortic valve area, by VTI measures 1.49 cm. Pulmonic Valve: The pulmonic valve was not well visualized. Aorta: The aortic root is normal in size and structure. Venous: The inferior vena cava was not well visualized. IAS/Shunts: No atrial level shunt detected by color flow Doppler. Additional Comments: A device lead is visualized.  LEFT VENTRICLE PLAX 2D LVIDd:  3.66 cm   Diastology LVIDs:         2.68 cm   LV e' medial:    6.42 cm/s LV PW:         1.15 cm   LV E/e' medial:  18.2 LV IVS:        0.83 cm   LV e' lateral:   5.55 cm/s LVOT diam:     2.10 cm   LV E/e' lateral: 21.1 LV SV:         29 LV SV Index:   18 LVOT Area:     3.46 cm  RIGHT VENTRICLE RV Basal diam:  2.77 cm LEFT ATRIUM             Index        RIGHT ATRIUM          Index LA diam:        3.90 cm 2.33 cm/m   RA Area:     7.90 cm LA Vol (A2C):   81.5 ml 48.77 ml/m  RA Volume:   11.80 ml 7.06 ml/m LA Vol (A4C):   45.0 ml 26.93 ml/m LA Biplane Vol: 66.6 ml 39.85 ml/m  AORTIC VALVE                    PULMONIC VALVE AV Area (Vmax):    1.57 cm     PV Vmax:       0.93 m/s AV Area (Vmean):   1.48 cm     PV Vmean:      65.800 cm/s AV Area (VTI):     1.49 cm     PV VTI:        0.153 m AV Vmax:           134.00 cm/s  PV Peak grad:  3.5 mmHg AV Vmean:          89.800 cm/s  PV Mean grad:  2.0 mmHg AV VTI:            0.197 m AV Peak Grad:      7.2 mmHg AV Mean Grad:      4.0 mmHg LVOT Vmax:         60.70 cm/s LVOT Vmean:        38.400 cm/s LVOT VTI:          0.085 m LVOT/AV VTI ratio: 0.43  AORTA Ao Root diam: 3.50 cm MITRAL VALVE                TRICUSPID VALVE MV Area (PHT): 3.36 cm     TR Peak grad:   30.7 mmHg MV Area VTI:   1.07 cm     TR Vmax:        277.00 cm/s MV Peak grad:  6.4 mmHg MV Mean grad:  2.0 mmHg     SHUNTS MV Vmax:       1.26 m/s     Systemic VTI:  0.08 m MV Vmean:       68.1 cm/s    Systemic Diam: 2.10 cm MV Decel Time: 226 msec MV E velocity: 117.00 cm/s Sena Slate Electronically signed by Sena Slate Signature Date/Time: 08/19/2021/4:56:02 PM    Final         Scheduled Meds:  aspirin  81 mg Oral Daily   furosemide  20 mg Intravenous Daily   hydrALAZINE  10 mg Oral TID   metoprolol succinate  12.5 mg Oral Daily   sodium chloride flush  3 mL Intravenous Q12H   traZODone  100 mg Oral QHS   warfarin  3 mg Oral Once per day on Sun Mon Tue Wed Fri Sat   warfarin  5 mg Oral Once per day on Thu   Warfarin - Pharmacist Dosing Inpatient   Does not apply q1600   Continuous Infusions:  sodium chloride      Assessment & Plan:   Principal Problem:   CHF (congestive heart failure) (HCC) Active Problems:   HOH (hard of hearing)   Acute on chronic systolic CHF (congestive heart failure) (HCC)   Leukocytosis   Elevated troponin   Essential hypertension   Acute respiratory failure with hypoxia (HCC)   PAF (paroxysmal atrial fibrillation) (HCC)   AKI (acute kidney injury) (HCC)   Goals of care, counseling/discussion   Acute respiratory failure with hypoxia Presented with shortness of breath and hypoxia to 87% on room air  2/2 acute systolic heart failure and volume overload BNP elevated Echo from 2017 EF 45 to 50% Echo here nml EF, diastolic function indeterminant 5/26 clinically improving We will continue low-dose IV Lasix monitoring renal function and electrolytes for today          2.  Acute on chronic systolic heart failure Echo as above 5/26 clinically improving Treatment as above I's and O's Daily weight        3. Elevated TP Likely due to demand ischemia from above Echo today with normal EF and no regional wall motion abnormality Cardiology following Was given asa 325mg , then 81mg  therafter. On  beta-blockers 5/26 troponin trending down  No chest pain    4. AKI Creatinine had increased mildly Now at baseline Monitor  with diuretics     5.PAF S/PPPM NSVT Pharmacy consulted for coumadin mx On beta blk  Echo nml ef and no wma On coumadin, pharmacy managing   6. Essential HTN Stable.  Continue current management.   7. Goals of care Palliative on board, meeting with family set up.   DVT prophylaxis: On Coumadin Code Status: DNR Family Communication: None at bedside Disposition Plan: SNF pending Status is: Inpatient Remains inpatient appropriate because: IV treatment, snf pending.       LOS: 2 days   Time spent:     6/26, MD [facility] Pager [phone]  If 7PM-7AM, please contact night-coverage 08/21/2021, 10:54 AM

## 2021-08-21 NOTE — Progress Notes (Signed)
Assumed care of pt at 1900. Alert to self and situation. Requiring 2 L  overnight. C/o chest discomfort overnight reporting "it is not pain, it just feels like burning." Notes it to be across her chest, but not radiating anywhere else. BP at this time elevated. When this RN returned to pt room w/ nitro, pt reports the pain to be gone. Provider overnight made aware. See new orders.   Pt remaining free of pain for the rest of the night w/ no intervention. Full assessment and vitals per flowsheets. Medication administration per MAR. Call bell within reach, making needs known.

## 2021-08-21 NOTE — Progress Notes (Signed)
Occupational Therapy Treatment Patient Details Name: Brandi Aguirre MRN: [id number] DOB: April 17, 1921 Today's Date: 08/21/2021   History of present illness 89yoF who is very hard of hearing with a PMH of  paroxysmal atrial fibrillation on warfarin, SSS s/p St. 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   OT comments  Pt seen for OT tx this date. Pt required MIN-MOD A for pericare/clothing mgt and linens/gown change after being found with saturated chuck pad in bed. Able to complete rolling and sup<>sit EOB with supervision. Pt declined OOB 2/2 just getting back to bed after being up in the recliner. Sat EOB for grooming tasks with set up and supervision for safety. Pt progressing, however, continues to require assist for all OOB and for ADL.    Recommendations for follow up therapy are one component of a multi-disciplinary discharge planning process, led by the attending physician.  Recommendations may be updated based on patient status, additional functional criteria and insurance authorization.    Follow Up Recommendations  Skilled nursing-short term rehab (<3 hours/day)    Assistance Recommended at Discharge Frequent or constant Supervision/Assistance  Patient can return home with the following  A little help with walking and/or transfers;A little help with bathing/dressing/bathroom;Assistance with cooking/housework;Assist for transportation;Help with stairs or ramp for entrance;Direct supervision/assist for medications management   Equipment Recommendations  None recommended by OT    Recommendations for Other Services      Precautions / Restrictions Precautions Precautions: Fall Restrictions Weight Bearing Restrictions: No       Mobility Bed Mobility Overal  bed mobility: Needs Assistance Bed Mobility: Rolling, Supine to Sit, Sit to Supine Rolling: Modified independent (Device/Increase time)   Supine to sit: Supervision Sit to supine: Supervision   General bed mobility comments: visual cues to initiate    Transfers                   General transfer comment: declined     Balance Overall balance assessment: Needs assistance Sitting-balance support: No upper extremity supported, Feet supported Sitting balance-Leahy Scale: Fair                                     ADL either performed or assessed with clinical judgement   ADL                                         General ADL Comments: Pt required MIN-MOD A for pericare/clothing mgt after being found with saturated chuck pad in bed. Pt declined OOB 2/2 just getting back to bed after being up in the recliner.    Extremity/Trunk Assessment              Vision       Perception     Praxis      Cognition Arousal/Alertness: Awake/alert Behavior During Therapy: WFL for tasks assessed/performed Overall Cognitive Status: Difficult to assess                                 General Comments: alert and follows commands well  with written instruction        Exercises      Shoulder Instructions       General Comments      Pertinent Vitals/ Pain       Pain Assessment Pain Assessment: No/denies pain  Home Living                                          Prior Functioning/Environment              Frequency  Min 2X/week        Progress Toward Goals  OT Goals(current goals can now be found in the care plan section)  Progress towards OT goals: Progressing toward goals  Acute Rehab OT Goals Patient Stated Goal: get better and go home OT Goal Formulation: With patient/family Time For Goal Achievement: 09/03/21 Potential to Achieve Goals: Good  Plan Discharge plan remains  appropriate;Frequency remains appropriate    Co-evaluation                 AM-PAC OT "6 Clicks" Daily Activity     Outcome Measure   Help from another person eating meals?: None Help from another person taking care of personal grooming?: A Little Help from another person toileting, which includes using toliet, bedpan, or urinal?: A Little Help from another person bathing (including washing, rinsing, drying)?: A Lot Help from another person to put on and taking off regular upper body clothing?: A Little Help from another person to put on and taking off regular lower body clothing?: A Lot 6 Click Score: 17    End of Session Equipment Utilized During Treatment: Oxygen  OT Visit Diagnosis: Other abnormalities of gait and mobility (R26.89);Muscle weakness (generalized) (M62.81);History of falling (Z91.81)   Activity Tolerance Patient tolerated treatment well   Patient Left in bed;with call bell/phone within reach;with bed alarm set   Nurse Communication          Time: 1540-0867 OT Time Calculation (min): 29 min  Charges: OT General Charges $OT Visit: 1 Visit OT Treatments $Self Care/Home Management : 23-37 mins  Arman Filter., MPH, MS, OTR/L ascom [phone] 08/21/21, 1:46 PM

## 2021-08-21 NOTE — Progress Notes (Signed)
Physical Therapy Treatment Patient Details Name: Brandi Aguirre MRN: [id number] DOB: [birth year] Today's Date: 08/21/2021   History of Present Illness 89yoF who is very hard of hearing with a PMH of  paroxysmal atrial fibrillation on warfarin, SSS s/p St. 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    PT Comments    Pt alert, primarily communicated throughout white board due to being [facility]. Pt denied pain during session. Supine to sit with minA and use of bed rails. modA-minA to come up into standing several times during session with use of RW. She was able to transfer to the recliner, and after a seated rest break ambulated ~82ft with RW and CGA-minA due to general unsteadiness. Pt demonstrated fatigue and some SOB after activity, HR and spO2 WFLs (on 2L via [place] throughout session). Pt settled into recliner with all needs in reach. The patient would benefit from further skilled PT intervention to continue to progress towards goals. Recommendation remains appropriate.     Recommendations for follow up therapy are one component of a multi-disciplinary discharge planning process, led by the attending physician.  Recommendations may be updated based on patient status, additional functional criteria and insurance authorization.  Follow Up Recommendations  Skilled nursing-short term rehab (<3 hours/day)     Assistance Recommended at Discharge Frequent or constant Supervision/Assistance  Patient can return home with the following A little help with bathing/dressing/bathroom;Assistance with cooking/housework;Assist for transportation;Help with stairs or ramp for entrance;A lot of help with walking and/or transfers   Equipment Recommendations  Other (comment) (TBD)     Recommendations for Other Services       Precautions / Restrictions Precautions Precautions: Fall Restrictions Weight Bearing Restrictions: No     Mobility  Bed Mobility Overal bed mobility: Needs Assistance Bed Mobility: Supine to Sit     Supine to sit: Min assist          Transfers Overall transfer level: Needs assistance Equipment used: Rolling walker (2 wheels) Transfers: Sit to/from Stand Sit to Stand: Min assist, Mod assist           General transfer comment: first attempt from EOB modA and pt quickly returned to sitting due to posterior LOB. from recliner pt able to stand with minA    Ambulation/Gait Ambulation/Gait assistance: Min assist, Min guard Gait Distance (Feet): 10 Feet Assistive device: Rolling walker (2 wheels)         General Gait Details: pt generally unsteady during ambulation   Stairs             Wheelchair Mobility    Modified Rankin (Stroke Patients Only)       Balance Overall balance assessment: Needs assistance Sitting-balance support: Single extremity supported, Feet supported Sitting balance-Leahy Scale: Fair     Standing balance support: Reliant on assistive device for balance Standing balance-Leahy Scale: Poor                              Cognition Arousal/Alertness: Awake/alert Behavior During Therapy: WFL for tasks assessed/performed Overall Cognitive Status: Difficult to assess                                 General  Comments: alert and follows commands well with written instruction        Exercises      General Comments        Pertinent Vitals/Pain      Home Living                          Prior Function            PT Goals (current goals can now be found in the care plan section) Progress towards PT goals: Progressing toward goals    Frequency    Min 2X/week      PT Plan Current plan remains appropriate    Co-evaluation               AM-PAC PT "6 Clicks" Mobility   Outcome Measure  Help needed turning from your back to your side while in a flat bed without using bedrails?: A Little Help needed moving from lying on your back to sitting on the side of a flat bed without using bedrails?: A Little Help needed moving to and from a bed to a chair (including a wheelchair)?: A Little Help needed standing up from a chair using your arms (e.g., wheelchair or bedside chair)?: A Little Help needed to walk in hospital room?: A Little Help needed climbing 3-5 steps with a railing? : A Lot 6 Click Score: 17    End of Session Equipment Utilized During Treatment: Oxygen Activity Tolerance: Patient tolerated treatment well Patient left: in chair;with chair alarm set;with call bell/phone within reach Nurse Communication: Mobility status PT Visit Diagnosis: Difficulty in walking, not elsewhere classified (R26.2);Other abnormalities of gait and mobility (R26.89);Muscle weakness (generalized) (M62.81)     Time: 3300-7622 PT Time Calculation (min) (ACUTE ONLY): 16 min  Charges:  $Therapeutic Activity: 8-22 mins                     Olga Coaster PT, DPT 11:11 AM,08/21/21

## 2021-08-21 NOTE — Progress Notes (Signed)
ANTICOAGULATION CONSULT NOTE   Pharmacy Consult for continuation of Warfarin Indication: atrial fibrillation  Allergies  Allergen Reactions   Sulfa Antibiotics Rash    Patient Measurements: Height: 5\' 5"  (165.1 cm) Weight: 52.9 kg (116 lb 10 oz) IBW/kg (Calculated) : 57 Heparin Dosing Weight:    Vital Signs: Temp: 98.1 F (36.7 C) (05/26 0315) Temp Source: Oral (05/26 0315) BP: 134/43 (05/26 0429) Pulse Rate: 70 (05/26 0315)  Labs: Recent Labs    08/19/21 0813 08/19/21 1055 08/20/21 0510 08/20/21 1231 08/20/21 1420 08/21/21 0536  HGB 13.6  --  11.9*  --   --   --   HCT 43.3  --  37.8  --   --   --   PLT 136*  --  118*  --   --   --   LABPROT 23.5*  --  19.0*  --   --  20.5*  INR 2.1*  --  1.6*  --   --  1.8*  CREATININE 0.95  --  1.14*  --   --   --   TROPONINIHS 99* 914*  --  579* 521*  --      Estimated Creatinine Clearance: 23 mL/min (A) (by C-G formula based on SCr of 1.14 mg/dL (H)).   Medical History: Past Medical History:  Diagnosis Date   Atrial fibrillation [facility])    Cochlear implant in place    Hypertension    Presence of permanent cardiac pacemaker    Sick sinus syndrome (HCC)    Wears dentures     Medications:  PTA Warfarin: 5mg  on Thursday, 3mg  all other days  Assessment: Patient is a 88yo female with a history of afib. She is anticoagulated on warfarin. Pharmacy consulted to manage warfarin dosing. Per anticoagulation notes, pt's dose was increase 8-10% but notes not clear on what dose the patient was prescribed.   Date INR Warfarin Dose  5/24 2.1 3 mg  5/25 1.6 5 mg   5/26 1.8 3 mg    Goal of Therapy:  INR 2-3 Monitor platelets by anticoagulation protocol: Yes   Plan:  INR is subtherapeutic. Will give warfarin 3 mg x 1 tonight x 1 (home dose). Daily INR. CBC at least every 3 days.   6/24, PharmD, BCPS 08/21/2021 7:47 AM

## 2021-08-21 NOTE — TOC Progression Note (Addendum)
Transition of Care [facility]) - Progression Note    Patient Details  Name: Brandi Aguirre MRN: [id number] Date of Birth: 08/14/1921  Transition of [facility] CM/SW Contact  Truddie Hidden, RN Phone Number: 08/21/2021, 1:14 PM  Clinical Narrative:   2:44pmSpoke with Bufford Buttner at [facility]. [facility] could accept patient tomorrow. Called a left a message for Darl Pikes regarding update about likely discharge tomorrow to [facility].   1:44pm  Talked with Vernell Barrier to give bed offers.  Darl Pikes agreeable to [facility]. Offer accepted in HUB. Left message for Dorene Ar to see when patient could be accepted.   Spoke with patient in the room about bed offers. Patient request I speak with her daughter. Attempt to reach Kathe Mariner @ [phone]. No answer. Left a message regarding bed offers. Requested return call to discuss discharge plan.    Expected Discharge Plan: Skilled Nursing Facility Barriers to Discharge: Continued Medical Work up  Expected Discharge Plan and Services Expected Discharge Plan: Skilled Nursing Facility                                               Social Determinants of Health (SDOH) Interventions    Readmission Risk Interventions     View : No data to display.

## 2021-08-21 NOTE — Progress Notes (Signed)
Nutrition Follow-up  DOCUMENTATION CODES:   Severe malnutrition in context of chronic illness  INTERVENTION:   -Ensure Enlive po TID, each supplement provides 350 kcal and 20 grams of protein -MVI with minerals daily -Liberalize diet to 2 gram sodium for wider variety of meal selections  NUTRITION DIAGNOSIS:   Severe Malnutrition related to chronic illness (CHF) as evidenced by percent weight loss, moderate fat depletion, severe fat depletion, moderate muscle depletion, severe muscle depletion.  GOAL:   Patient will meet greater than or equal to 90% of their needs  MONITOR:   PO intake, Supplement acceptance  REASON FOR ASSESSMENT:   Rounds    ASSESSMENT:   Pt with PMH of  paroxysmal atrial fibrillation on warfarin, SSS s/p [employer] 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  Pt admitted with CHF.   Reviewed I/O's: -777 ml x 24 hours  UOP: 900 ml x 24 hours   Spoke with pt at bedside; pt is very heard of hearing and used a white board to communicate with her. Pt reports an extremely poor appetite for the past few months. Pt shares that she lives in a ALF and is served two meals per day, but often consumes only bites of meals. Observed lunch tray- pt consumed only a few bites of fish and mac and cheese. Noted meal completions 0-50%.   Pt endorses wt loss, but is unsure of her UBW or how much weight she has lost. She shares with this RD that her shoes started falling off her her a few months ago, which is why she knows she has been losing weight. Reviewed wt hx; pt has experienced a 10.1% wt loss over the past month, which is significant for time frame.  Discussed importance of good meal and supplement intake to promote healing. Pt amenable to Ensure.   Medications reviewed and include lasix.   Palliative care following for goals of care discussions.    Labs reviewed.   NUTRITION - FOCUSED PHYSICAL EXAM:  Flowsheet Row Most Recent Value  Orbital Region Severe depletion  Upper Arm Region Severe depletion  Thoracic and Lumbar Region Severe depletion  Buccal Region Severe depletion  Temple Region Severe depletion  Clavicle Bone Region Severe depletion  Clavicle and Acromion Bone Region Severe depletion  Scapular Bone Region Severe depletion  Dorsal Hand Severe depletion  Patellar Region Severe depletion  Anterior Thigh Region Severe depletion  Posterior Calf Region Severe depletion  Edema (RD Assessment) Mild  Hair Reviewed  Eyes Reviewed  Mouth Reviewed  Skin Reviewed  Nails Reviewed       Diet Order:   Diet Order             Diet 2 gram sodium Room service appropriate? Yes; Fluid consistency: Thin  Diet effective now                   EDUCATION NEEDS:   Education needs have been addressed  Skin:  Skin Assessment: Reviewed RN Assessment  Last BM:  Unknown  Height:   Ht Readings from Last 1 Encounters:  08/19/21 _0  (1.651 m)    Weight:   Wt Readings from Last 1 Encounters:  08/21/21 52.9 kg    Ideal Body Weight:  56.8 kg  BMI:  Body mass index is 19.41 kg/m.  Estimated Nutritional Needs:   Kcal:  1550-1750  Protein:  80-95 grams  Fluid:  >  1.5 L    Loistine Chance, RD, LDN, [place] Registered Dietitian II Certified Diabetes Care and Education Specialist Please refer to [facility] for RD and/or RD on-call/weekend/after hours pager

## 2021-08-21 NOTE — Progress Notes (Signed)
Daily Progress Note   Patient Name: Brandi Aguirre       Date: 08/21/2021 DOB: 07-Apr-1921  Age: 88 y.o. MRN#: [id number] Attending Physician: Nolberto Hanlon, MD Primary Care Physician: Valera Castle, MD Admit Date: 08/19/2021 Length of Stay: 2 days  Reason for Consultation/Follow-up: Establishing goals of care  HPI/Patient Profile:  88 y.o. female  with past medical history of A-fib on coumadin, hypertension , hard of hearing, PPM placement was brought by EMS after she called her daughter in law this am stating she is sob and cold. Patient found in heart failure and oxygen saturations of 80% 1% on room air on presentation secondary to systolic heart failure and volume overload, elevated BNP she was admitted on 08/19/2021 with acute respiratory failure with hypoxia, acute on chronic systolic heart failure, elevated troponins, A-fib, and others.   Current treatment plan includes diuretics, strict INO's, daily weights.  PMT was consulted for goals of care conversation given substantial chronic disease burden and advancing age.  Subjective:   Subjective: Chart Reviewed. Updates received. Patient Assessed. Created space and opportunity for patient  and family to explore thoughts and feelings regarding current medical situation.  Today's Discussion: I met with the patient and her daughter-in-law Brandi Aguirre at the bedside today.  I used the computer screen with a large font to type the patient given that she is very hard of hearing.  After our discussions it appears she does not want surgery, has previously refused to go back for permanent pacemaker check because if her battery is low to want to do surgeries with Brandi Aguirre.  We discussed whether or not she would want to go to therapy.  She indicates she does not really want to but knows that she is very weak and unsure if she can make it at her independent living facility in the state.  There is a question of whether or not to continue the Coumadin depending  on the particular path of care she chooses.  I offered that hospice is an option where we can stop curative treatments and focus on comfort, dignity, peace.  The goal of this care would be to avoid hospitalization, avoid going to the doctor's office frequently, allow hospice care staff to come to her and promote quality of life and treat any symptoms he may develop.  The patient's family and the patient both seem to be on board with this philosophy.  However, they have several concerns including her ability to go home in her current state.  I indicated I would speak with the medical care team to see if we can come up with solutions.  I messaged the hospitalist, therapist, nurse, and social worker/TOC to discuss possibilities.  I offered that possible primary caregiver for couple hours a day along with family support could be an option.  Additionally, apparently the facility could deliver her meals to her.  The patient's daughter has also that she can speak to the facility director to see what options are possible.  I provided emotional general support through therapeutic listening, empathy, sharing stories, and other techniques.  I answered her questions and addressed all concerns to the best of my ability.  Review of Systems  Constitutional:  Positive for fatigue.  Respiratory:  Positive for shortness of breath (with ambulation). Negative for cough.   Cardiovascular:  Negative for chest pain.  Gastrointestinal:  Negative for abdominal pain, nausea and vomiting.  Neurological:  Positive for weakness.   Objective:   Vital Signs:  BP Marland Kitchen)  155/49 (BP Location: Left Arm)   Pulse 66   Temp 98.3 F (36.8 C) (Oral)   Resp 18   Ht 5' 5"  (1.651 m)   Wt 52.9 kg   SpO2 97%   BMI 19.41 kg/m   Physical Exam: Physical Exam Vitals and nursing note reviewed.  Constitutional:      General: She is not in acute distress.    Appearance: She is ill-appearing.  HENT:     Head: Normocephalic and  atraumatic.     Ears:     Comments: Hard of hearing Pulmonary:     Effort: Pulmonary effort is normal. No respiratory distress.  Abdominal:     General: Abdomen is flat.     Palpations: Abdomen is soft.  Skin:    General: Skin is warm and dry.  Neurological:     Mental Status: She is alert.  Psychiatric:        Mood and Affect: Mood normal.        Behavior: Behavior normal.    Palliative Assessment/Data: 50%   Assessment & Plan:   Impression: Present on Admission: **None**  88 year old female with advancing age, multiple chronic comorbidities, noted decline at home and loss of interest in things that typically brought her joy.  She is admitted now with acute heart failure with hypoxia, A-fib.  She seems to be stable or slightly improving.  Given her multiple chronic illnesses, 2 admissions and ED visits since October 2022 she would benefit from goals of care conversation.  Her daughter is concerned she will agree to what ever is recommended.  We will attempt to elicit the patient's actual goals for care.  Based on my conversation with the patient's daughter-in-law and the patient, they are agreeable to the hospice philosophy in theory, if some concerns about safety could be addressed.  SUMMARY OF RECOMMENDATIONS   Remain DNR Discuss possible options to allow her to go home with hospice support and avoid unwanted procedures, follow-ups, hospitalizations Continued patient and family support PMT will continue to follow  Symptom Management:  Per primary team PMT is available to assist as needed  Code Status: DNR  Prognosis: Unable to determine  Discharge Planning: To Be Determined  Discussed with: Patient, patient's family, TOC team, medical team, nursing team, PT, OT  Thank you for allowing [place] to participate in the care of Brandi Aguirre PMT will continue to support holistically.  Time Total: 90 min  Visit consisted of counseling and education dealing with the complex  and emotionally intense issues of symptom management and palliative care in the setting of serious and potentially life-threatening illness. Greater than 50%  of this time was spent counseling and coordinating care related to the above assessment and plan.  Walden Field, NP Palliative Medicine Team  Team Phone # [phone] (Nights/Weekends)  11/25/2020, 8:17 AM

## 2021-08-22 DIAGNOSIS — N179 Acute kidney failure, unspecified: Secondary | ICD-10-CM

## 2021-08-22 DIAGNOSIS — J9601 Acute respiratory failure with hypoxia: Secondary | ICD-10-CM | POA: Diagnosis not present

## 2021-08-22 DIAGNOSIS — E43 Unspecified severe protein-calorie malnutrition: Secondary | ICD-10-CM | POA: Insufficient documentation

## 2021-08-22 DIAGNOSIS — I5023 Acute on chronic systolic (congestive) heart failure: Secondary | ICD-10-CM | POA: Diagnosis not present

## 2021-08-22 DIAGNOSIS — Z66 Do not resuscitate: Secondary | ICD-10-CM | POA: Diagnosis not present

## 2021-08-22 LAB — PROTIME-INR
INR: 1.8 — ABNORMAL HIGH (ref 0.8–1.2)
Prothrombin Time: 20.3 seconds — ABNORMAL HIGH (ref 11.4–15.2)

## 2021-08-22 LAB — POTASSIUM: Potassium: 4 mmol/L (ref 3.5–5.1)

## 2021-08-22 LAB — CREATININE, SERUM
Creatinine, Ser: 1.04 mg/dL — ABNORMAL HIGH (ref 0.44–1.00)
GFR, Estimated: 49 mL/min — ABNORMAL LOW (ref >60)

## 2021-08-22 MED ORDER — FUROSEMIDE 10 MG/ML IJ SOLN
20.0000 mg | Freq: Once | INTRAMUSCULAR | Status: AC
  Administered 2021-08-22: 20 mg via INTRAVENOUS
  Filled 2021-08-22: qty 2

## 2021-08-22 MED ORDER — FUROSEMIDE 20 MG PO TABS
20.0000 mg | ORAL_TABLET | Freq: Every day | ORAL | Status: DC
  Administered 2021-08-23 – 2021-08-25 (×3): 20 mg via ORAL
  Filled 2021-08-22 (×3): qty 1

## 2021-08-22 NOTE — Progress Notes (Signed)
ANTICOAGULATION CONSULT NOTE   Pharmacy Consult for continuation of Warfarin Indication: atrial fibrillation  Allergies  Allergen Reactions   Sulfa Antibiotics Rash    Patient Measurements: Height: 5\' 5"  (165.1 cm) Weight: 50.7 kg (111 lb 12.4 oz) IBW/kg (Calculated) : 57 Heparin Dosing Weight:    Vital Signs: Temp: 98 F (36.7 C) (05/27 0747) Temp Source: Oral (05/27 0352) BP: 121/57 (05/27 0747) Pulse Rate: 85 (05/27 0747)  Labs: Recent Labs    08/19/21 1055 08/20/21 0510 08/20/21 1231 08/20/21 1420 08/21/21 0536 08/22/21 0441  HGB  --  11.9*  --   --   --   --   HCT  --  37.8  --   --   --   --   PLT  --  118*  --   --  126*  --   LABPROT  --  19.0*  --   --  20.5* 20.3*  INR  --  1.6*  --   --  1.8* 1.8*  CREATININE  --  1.14*  --   --  0.97 1.04*  TROPONINIHS 914*  --  579* 521*  --   --      Estimated Creatinine Clearance: 24.2 mL/min (A) (by C-G formula based on SCr of 1.04 mg/dL (H)).   Medical History: Past Medical History:  Diagnosis Date   Atrial fibrillation [facility])    Cochlear implant in place    Hypertension    Presence of permanent cardiac pacemaker    Sick sinus syndrome (HCC)    Wears dentures     Medications:  PTA Warfarin: 5mg  on Thursday, 3mg  all other days  Assessment: Patient is a 88yo female with a history of afib. She is anticoagulated on warfarin. Pharmacy consulted to manage warfarin dosing. Per anticoagulation notes, pt's dose was increase 8-10% but notes not clear on what dose the patient was prescribed.   Date INR Warfarin Dose  5/24 2.1 3 mg  5/25 1.6 5 mg   5/26 1.8 3 mg  5/27 1.8 3 mg    Goal of Therapy:  INR 2-3 Monitor platelets by anticoagulation protocol: Yes   Plan:  INR is subtherapeutic. Will give warfarin 3 mg x 1 tonight x 1 (home dose). Daily INR. CBC at least every 3 days.   6/25, PharmD Clinical Pharmacist 08/22/2021 9:25 AM

## 2021-08-22 NOTE — TOC Progression Note (Signed)
Transition of Care [facility]) - Progression Note    Patient Details  Name: Brandi Aguirre MRN: [id number] Date of Birth: 1921-05-01  Transition of Care [facility]) CM/SW Contact  Bing Quarry, RN Phone Number: 08/22/2021, 9:17 AM  Clinical Narrative:  08/22/21: 5277. No call back from [facility] at [place] documented from last CM attempt at 244 pm 5/56. Left another VM and messaged for [facility] Balsam at [place], [phone], with call back information for weekend RN CM. Gabriel Cirri RN Caryl Ada [phone]   [phone] am: Shari Prows at [place] and she is checking on possible admission today as she was not present onsite yesterday. Will call RN CM back with information. Gabriel Cirri RN CM   Expected Discharge Plan: Skilled Nursing Facility Barriers to Discharge: Continued Medical Work up  Expected Discharge Plan and Services Expected Discharge Plan: Skilled Nursing Facility                                               Social Determinants of Health (SDOH) Interventions    Readmission Risk Interventions     View : No data to display.

## 2021-08-22 NOTE — Progress Notes (Signed)
Assumed care of pt at 1900. A&O. Requiring 2 L [place] overnight. At approx 2300, pt c/o burning sensation across chest and SOB. EKG obtained revealing afib RVR. Provider notified, see new orders. CP resolved shortly after this. HR sustaining in the 90s at this time.   Full assessment per flowsheets. Medication administration per MAR. Call bell within reach. Making needs known.

## 2021-08-22 NOTE — Progress Notes (Signed)
PROGRESS NOTE    Brandi Aguirre  ZMO:[id number] DOB: March 01, 1922 DOA: 08/19/2021 PCP: Dione Housekeeper, MD    Brief Narrative:  Brandi Aguirre is a 88 y.o. female with medical history significant of history of A-fib on coumadin, hypertension , hard of hearing, PPM placement was brought by EMS after she called her daughter in law this am stating she is sob and cold Patient found in heart failure   PT OT recommended SNF  5/26 feeling better, less sob. Eating breakfast. Palliative consulted, and meeting family 5/27 spoke to pt this am and then with daughter-in-law.  Daughter-in-law and patient feel that she is too weak to be going to rehab.  Patient had expressed not wanting to live due to her medical conditions and she is tired of going through this.  Spoke to daughter-in-law about hospice care and she is agreeable for them to evaluate patient for hospice house  Overnight issues noted  Consultants:  Cardiology, palliative  Procedures:   Antimicrobials:      Subjective: Less sob. Tired. Feels weak.   Objective: Vitals:   08/22/21 0352 08/22/21 0540 08/22/21 0747 08/22/21 1157  BP: 101/63  (!) 121/57 (!) 145/55  Pulse: 99  85 92  Resp: 18  18 18   Temp: 98 F (36.7 C)  98 F (36.7 C) 98.1 F (36.7 C)  TempSrc: Oral     SpO2: 96%  97% 98%  Weight:  50.7 kg    Height:        Intake/Output Summary (Last 24 hours) at 08/22/2021 1232 Last data filed at 08/22/2021 1100 Gross per 24 hour  Intake 960 ml  Output 400 ml  Net 560 ml   Filed Weights   08/20/21 0500 08/21/21 0429 08/22/21 0540  Weight: 55 kg 52.9 kg 50.7 kg    Examination: Calm, NAD Cta no w/r, no wheezing or rales Reg s1/s2 no gallop Soft benign +bs No edema Aaoxox3  Mood and affect appropriate in current setting     Data Reviewed: I have personally reviewed following labs and imaging studies  CBC: Recent Labs  Lab 08/19/21 0813 08/20/21 0510 08/21/21 0536  WBC 12.4* 6.3  --   HGB 13.6  11.9*  --   HCT 43.3 37.8  --   MCV 95.2 95.5  --   PLT 136* 118* 126*   Basic Metabolic Panel: Recent Labs  Lab 08/19/21 0813 08/20/21 0510 08/21/21 0536 08/22/21 0441  NA 140 141 141  --   K 4.1 3.8 3.7 4.0  CL 104 105 104  --   CO2 27 28 30   --   GLUCOSE 140* 86 85  --   BUN 16 23 27*  --   CREATININE 0.95 1.14* 0.97 1.04*  CALCIUM 8.8* 8.4* 8.4*  --    GFR: Estimated Creatinine Clearance: 24.2 mL/min (A) (by C-G formula based on SCr of 1.04 mg/dL (H)). Liver Function Tests: Recent Labs  Lab 08/19/21 0813  AST 19  ALT 10  ALKPHOS 68  BILITOT 0.8  PROT 6.7  ALBUMIN 3.5   No results for input(s): LIPASE, AMYLASE in the last 168 hours. No results for input(s): AMMONIA in the last 168 hours. Coagulation Profile: Recent Labs  Lab 08/19/21 0813 08/20/21 0510 08/21/21 0536 08/22/21 0441  INR 2.1* 1.6* 1.8* 1.8*   Cardiac Enzymes: No results for input(s): CKTOTAL, CKMB, CKMBINDEX, TROPONINI in the last 168 hours. BNP (last 3 results) No results for input(s): PROBNP in the last 8760 hours. HbA1C: No results  for input(s): HGBA1C in the last 72 hours. CBG: No results for input(s): GLUCAP in the last 168 hours. Lipid Profile: No results for input(s): CHOL, HDL, LDLCALC, TRIG, CHOLHDL, LDLDIRECT in the last 72 hours. Thyroid Function Tests: No results for input(s): TSH, T4TOTAL, FREET4, T3FREE, THYROIDAB in the last 72 hours. Anemia Panel: No results for input(s): VITAMINB12, FOLATE, FERRITIN, TIBC, IRON, RETICCTPCT in the last 72 hours. Sepsis Labs: No results for input(s): PROCALCITON, LATICACIDVEN in the last 168 hours.  No results found for this or any previous visit (from the past 240 hour(s)).       Radiology Studies: No results found.      Scheduled Meds:  aspirin  81 mg Oral Daily   feeding supplement  237 mL Oral TID BM   furosemide  20 mg Intravenous Daily   metoprolol succinate  12.5 mg Oral Daily   multivitamin with minerals  1 tablet  Oral Daily   sodium chloride flush  3 mL Intravenous Q12H   traZODone  100 mg Oral QHS   warfarin  3 mg Oral Once per day on Sun Mon Tue Wed Fri Sat   warfarin  5 mg Oral Once per day on Thu   Warfarin - Pharmacist Dosing Inpatient   Does not apply q1600   Continuous Infusions:  sodium chloride      Assessment & Plan:   Principal Problem:   CHF (congestive heart failure) (HCC) Active Problems:   HOH (hard of hearing)   Acute on chronic systolic CHF (congestive heart failure) (HCC)   Leukocytosis   Elevated troponin   Essential hypertension   Acute respiratory failure with hypoxia (HCC)   PAF (paroxysmal atrial fibrillation) (HCC)   AKI (acute kidney injury) (HCC)   Goals of care, counseling/discussion   Palliative care by specialist   DNR (do not resuscitate)   Protein-calorie malnutrition, severe   Acute respiratory failure with hypoxia Presented with shortness of breath and hypoxia to 87% on room air  2/2 acute systolic heart failure and volume overload BNP elevated Echo from 2017 EF 45 to 50% Echo here nml EF, diastolic function indeterminant 5/27 clinically improving  DC IV Lasix and switch to p.o. 20 mg in a.m.         2.  Acute on chronic systolic heart failure Echo as above 5/27 clinically improving Switch IV Lasix to p.o. Continue metoprolol        3. Elevated TP Likely due to demand ischemia from above Echo today with normal EF and no regional wall motion abnormality Cardiology following Was given asa 325mg , then 81mg  therafter. On  beta-blockers 5/27 no chest pain     4. AKI Mildly increased likely from diuresis Switch to p.o. Lasix Monitor   5.PAF S/PPPM NSVT  Echo nml ef and no wma 5/27 had A-fib RVR early this morning Continue beta-blockers increase as tolerated On Coumadin dosing per pharmacy   6. Essential HTN Stable.  Continue current management.   7. Goals of care Palliative on board, meeting with family set up. 5/27  plan for hospice to evaluate patient , discussed with daughter in law . Patient and daughter Worthy Flankdont think she can handle rehab. Patient expressed tired of her illness and doesn't want to return to the hospital again   DVT prophylaxis: On Coumadin Code Status: DNR Family Communication: daughter in law updated  Disposition Plan: hospice house to evaluate pt Status is: Inpatient Remains inpatient appropriate because: IV treatment, having hospice evaluate pt  LOS: 3 days   Time spent: 35 min    Lynn Ito, MD [facility] Pager [phone]  If 7PM-7AM, please contact night-coverage 08/22/2021, 12:32 PM

## 2021-08-22 NOTE — Progress Notes (Signed)
Civil engineer, contracting [facility]) Hospital Liaison Note  Received request from Transitions of Care Manager  Britta Mccreedy for family interest in Hospice Home. Visited patient at bedside and spoke with Daughter/Susan to confirm interest and explain services.  Approval for Hospice Home is determined by [facility] MD. Once [facility] MD has determined Hospice Home eligibility, [facility] will update hospital staff and family. Patient has been approved.   Unfortunately, Hospice Home is not able to offer a room today. Family and [facility] Manager aware hospital liaison will follow up tomorrow or sooner if a room becomes available.    Please do not hesitate to call with any hospice related questions.    Thank you for the opportunity to participate in this patient's care.   Odette Fraction, MSW [facility] Liaison [phone]

## 2021-08-23 DIAGNOSIS — I5023 Acute on chronic systolic (congestive) heart failure: Secondary | ICD-10-CM | POA: Diagnosis not present

## 2021-08-23 DIAGNOSIS — N179 Acute kidney failure, unspecified: Secondary | ICD-10-CM | POA: Diagnosis not present

## 2021-08-23 DIAGNOSIS — J9601 Acute respiratory failure with hypoxia: Secondary | ICD-10-CM | POA: Diagnosis not present

## 2021-08-23 DIAGNOSIS — Z66 Do not resuscitate: Secondary | ICD-10-CM | POA: Diagnosis not present

## 2021-08-23 LAB — CREATININE, SERUM
Creatinine, Ser: 0.97 mg/dL (ref 0.44–1.00)
GFR, Estimated: 53 mL/min — ABNORMAL LOW (ref >60)

## 2021-08-23 LAB — PROTIME-INR
INR: 1.7 — ABNORMAL HIGH (ref 0.8–1.2)
Prothrombin Time: 19.9 seconds — ABNORMAL HIGH (ref 11.4–15.2)

## 2021-08-23 MED ORDER — WARFARIN SODIUM 5 MG PO TABS
5.0000 mg | ORAL_TABLET | Freq: Once | ORAL | Status: AC
  Administered 2021-08-23: 5 mg via ORAL
  Filled 2021-08-23: qty 1

## 2021-08-23 NOTE — Progress Notes (Signed)
[place] for continuation of Warfarin Indication: atrial fibrillation  Allergies  Allergen Reactions   Sulfa Antibiotics Rash    Patient Measurements: Height: 5\' 5"  (165.1 cm) Weight: 49.8 kg (109 lb 13.7 oz) IBW/kg (Calculated) : 57 Heparin Dosing Weight:    Vital Signs: Temp: 98.2 F (36.8 C) (05/28 0946) Temp Source: Oral (05/28 0559) BP: 135/88 (05/28 0946) Pulse Rate: 63 (05/28 0946)  Labs: Recent Labs    08/20/21 1231 08/20/21 1420 08/21/21 0536 08/22/21 0441 08/23/21 0336  PLT  --   --  126*  --   --   LABPROT  --   --  20.5* 20.3* 19.9*  INR  --   --  1.8* 1.8* 1.7*  CREATININE  --   --  0.97 1.04* 0.97  TROPONINIHS 579* 521*  --   --   --      Estimated Creatinine Clearance: 25.5 mL/min (by C-G formula based on SCr of 0.97 mg/dL).   Medical History: Past Medical History:  Diagnosis Date   Atrial fibrillation [facility])    Cochlear implant in place    Hypertension    Presence of permanent cardiac pacemaker    Sick sinus syndrome ([place])    Wears dentures     Medications:  PTA Warfarin: 5mg  on Thursday, 3mg  all other days  Assessment: Patient is a 88yo female with a history of afib. She is anticoagulated on warfarin. Pharmacy consulted to manage warfarin dosing. Per anticoagulation notes, pt's dose was increase 8-10% but notes not clear on what dose the patient was prescribed.   Date INR Warfarin Dose  5/24 2.1 3 mg  5/25 1.6 5 mg   5/26 1.8 3 mg  5/27 1.8 3 mg  5/27 1.7 5 mg    Goal of Therapy:  INR 2-3 Monitor platelets by anticoagulation protocol: Yes   Plan:  INR is subtherapeutic. Will give warfarin 5 mg x 1 today.  Daily INR. CBC at least every 3 days.   Pearla Dubonnet, PharmD Clinical Pharmacist 08/23/2021 10:19 AM

## 2021-08-23 NOTE — Progress Notes (Signed)
PROGRESS NOTE    Brandi Aguirre  HUD:[id number] DOB: [birth year] DOA: 08/19/2021 PCP: Dione Housekeeper, MD    Brief Narrative:  Brandi Aguirre is a 88 y.o. female with medical history significant of history of A-fib on coumadin, hypertension , hard of hearing, PPM placement was brought by EMS after she called her daughter in law this am stating she is sob and cold Patient found in heart failure   PT OT recommended SNF  5/26 feeling better, less sob. Eating breakfast. Palliative consulted, and meeting family 5/27 spoke to pt this am and then with daughter-in-law.  Daughter-in-law and patient feel that she is too weak to be going to rehab.  Patient had expressed not wanting to live due to her medical conditions and she is tired of going through this.  Spoke to daughter-in-law about hospice care and she is agreeable for them to evaluate patient for hospice house  5/28 hospice house.  Pending  Consultants:  Cardiology, palliative  Procedures:   Antimicrobials:      Subjective: Reports feeling fine.  Denies shortness of breath or chest pain  Objective: Vitals:   08/22/21 2348 08/23/21 0430 08/23/21 0559 08/23/21 0946  BP: 125/80  (!) 134/52 135/88  Pulse: 89  (!) 103 63  Resp: 18  16   Temp: 97.9 F (36.6 C)  97.9 F (36.6 C) 98.2 F (36.8 C)  TempSrc: Oral  Oral   SpO2: 97%  95% 94%  Weight:  49.8 kg    Height:        Intake/Output Summary (Last 24 hours) at 08/23/2021 1136 Last data filed at 08/23/2021 1004 Gross per 24 hour  Intake 477 ml  Output 400 ml  Net 77 ml   Filed Weights   08/21/21 0429 08/22/21 0540 08/23/21 0430  Weight: 52.9 kg 50.7 kg 49.8 kg    Examination: Calm, NAD Cta no w/r Reg s1/s2 no gallop Soft benign +bs No edema Awake and alert Mood and affect appropriate in current setting     Data Reviewed: I have personally reviewed following labs and imaging studies  CBC: Recent Labs  Lab 08/19/21 0813 08/20/21 0510 08/21/21 0536   WBC 12.4* 6.3  --   HGB 13.6 11.9*  --   HCT 43.3 37.8  --   MCV 95.2 95.5  --   PLT 136* 118* 126*   Basic Metabolic Panel: Recent Labs  Lab 08/19/21 0813 08/20/21 0510 08/21/21 0536 08/22/21 0441 08/23/21 0336  NA 140 141 141  --   --   K 4.1 3.8 3.7 4.0  --   CL 104 105 104  --   --   CO2 27 28 30   --   --   GLUCOSE 140* 86 85  --   --   BUN 16 23 27*  --   --   CREATININE 0.95 1.14* 0.97 1.04* 0.97  CALCIUM 8.8* 8.4* 8.4*  --   --    GFR: Estimated Creatinine Clearance: 25.5 mL/min (by C-G formula based on SCr of 0.97 mg/dL). Liver Function Tests: Recent Labs  Lab 08/19/21 0813  AST 19  ALT 10  ALKPHOS 68  BILITOT 0.8  PROT 6.7  ALBUMIN 3.5   No results for input(s): LIPASE, AMYLASE in the last 168 hours. No results for input(s): AMMONIA in the last 168 hours. Coagulation Profile: Recent Labs  Lab 08/19/21 0813 08/20/21 0510 08/21/21 0536 08/22/21 0441 08/23/21 0336  INR 2.1* 1.6* 1.8* 1.8* 1.7*   Cardiac Enzymes: No  results for input(s): CKTOTAL, CKMB, CKMBINDEX, TROPONINI in the last 168 hours. BNP (last 3 results) No results for input(s): PROBNP in the last 8760 hours. HbA1C: No results for input(s): HGBA1C in the last 72 hours. CBG: No results for input(s): GLUCAP in the last 168 hours. Lipid Profile: No results for input(s): CHOL, HDL, LDLCALC, TRIG, CHOLHDL, LDLDIRECT in the last 72 hours. Thyroid Function Tests: No results for input(s): TSH, T4TOTAL, FREET4, T3FREE, THYROIDAB in the last 72 hours. Anemia Panel: No results for input(s): VITAMINB12, FOLATE, FERRITIN, TIBC, IRON, RETICCTPCT in the last 72 hours. Sepsis Labs: No results for input(s): PROCALCITON, LATICACIDVEN in the last 168 hours.  No results found for this or any previous visit (from the past 240 hour(s)).       Radiology Studies: No results found.      Scheduled Meds:  aspirin  81 mg Oral Daily   feeding supplement  237 mL Oral TID BM   furosemide  20 mg  Oral Daily   metoprolol succinate  12.5 mg Oral Daily   multivitamin with minerals  1 tablet Oral Daily   sodium chloride flush  3 mL Intravenous Q12H   traZODone  100 mg Oral QHS   warfarin  5 mg Oral ONCE-1600   Warfarin - Pharmacist Dosing Inpatient   Does not apply q1600   Continuous Infusions:  sodium chloride      Assessment & Plan:   Principal Problem:   CHF (congestive heart failure) (HCC) Active Problems:   HOH (hard of hearing)   Acute on chronic systolic CHF (congestive heart failure) (HCC)   Leukocytosis   Elevated troponin   Essential hypertension   Acute respiratory failure with hypoxia (HCC)   PAF (paroxysmal atrial fibrillation) (HCC)   AKI (acute kidney injury) (HCC)   Goals of care, counseling/discussion   Palliative care by specialist   DNR (do not resuscitate)   Protein-calorie malnutrition, severe   Acute respiratory failure with hypoxia Presented with shortness of breath and hypoxia to 87% on room air  2/2 acute systolic heart failure and volume overload BNP elevated Echo from 2017 EF 45 to 50% Echo here nml EF, diastolic function indeterminant 5/28 clinically has improved  Start lasix 20mg  po qd today           2.  Acute on chronic systolic heart failure Echo as above 5/20 clinically has improved More euvolemic Continue metoprolol Start Lasix p.o. as above today        3. Elevated TP Likely due to demand ischemia from above Echo today with normal EF and no regional wall motion abnormality Cardiology following Was given asa 325mg , then 81mg  therafter. On  beta-blockers 5/28 no chest pain    4. AKI Mildly increased likely from diuresis 5/28 creatinine improved at baseline    5.PAF S/PPPM NSVT  Echo nml ef and no wma 5/27 had A-fib RVR early this morning Continue beta-blockers increase as tolerated On Coumadin dosing per pharmacy   6. Essential HTN Stable.  Continue current management.   7. Goals of  care Palliative on board, meeting with family set up. 5/27 plan for hospice to evaluate patient , discussed with daughter in law . Patient and daughter 6/28 think she can handle rehab. Patient expressed tired of her illness and doesn't want to return to the hospital again 5/28 plan to go to hospice facility when bed available.   DVT prophylaxis: On Coumadin Code Status: DNR Family Communication: None at bedside Disposition Plan:  Status  is: Inpatient Remains inpatient appropriate because: Unsafe discharge.  Awaiting hospice house bed pending         LOS: 4 days   Time spent: 35 min    Lynn ItoSahar Jakobie Henslee, MD [facility] Pager [phone]  If 7PM-7AM, please contact night-coverage 08/23/2021, 11:36 AM

## 2021-08-23 NOTE — Progress Notes (Signed)
Civil engineer, contracting [facility]) Hospital Liaison Note   Unfortunately, Hospice Home is not able to offer a room today. Family and [facility] Manager aware hospital liaison will follow up tomorrow or sooner if a room becomes available.    Please do not hesitate to call with any hospice related questions.    Thank you for the opportunity to participate in this patient's care.   Odette Fraction, MSW [facility] Liaison [phone]

## 2021-08-24 DIAGNOSIS — Z515 Encounter for palliative care: Secondary | ICD-10-CM | POA: Diagnosis not present

## 2021-08-24 DIAGNOSIS — N179 Acute kidney failure, unspecified: Secondary | ICD-10-CM | POA: Diagnosis not present

## 2021-08-24 DIAGNOSIS — Z7189 Other specified counseling: Secondary | ICD-10-CM | POA: Diagnosis not present

## 2021-08-24 DIAGNOSIS — J9601 Acute respiratory failure with hypoxia: Secondary | ICD-10-CM | POA: Diagnosis not present

## 2021-08-24 DIAGNOSIS — I5023 Acute on chronic systolic (congestive) heart failure: Secondary | ICD-10-CM | POA: Diagnosis not present

## 2021-08-24 DIAGNOSIS — E43 Unspecified severe protein-calorie malnutrition: Secondary | ICD-10-CM

## 2021-08-24 DIAGNOSIS — R778 Other specified abnormalities of plasma proteins: Secondary | ICD-10-CM | POA: Diagnosis not present

## 2021-08-24 LAB — CBC
HCT: 39.2 % (ref 36.0–46.0)
Hemoglobin: 12.4 g/dL (ref 12.0–15.0)
MCH: 30 pg (ref 26.0–34.0)
MCHC: 31.6 g/dL (ref 30.0–36.0)
MCV: 94.9 fL (ref 80.0–100.0)
Platelets: 121 10*3/uL — ABNORMAL LOW (ref 150–400)
RBC: 4.13 MIL/uL (ref 3.87–5.11)
RDW: 13.2 % (ref 11.5–15.5)
WBC: 6.9 10*3/uL (ref 4.0–10.5)
nRBC: 0 % (ref 0.0–0.2)

## 2021-08-24 LAB — PROTIME-INR
INR: 1.4 — ABNORMAL HIGH (ref 0.8–1.2)
Prothrombin Time: 17.3 seconds — ABNORMAL HIGH (ref 11.4–15.2)

## 2021-08-24 MED ORDER — WARFARIN SODIUM 5 MG PO TABS
5.0000 mg | ORAL_TABLET | Freq: Once | ORAL | Status: AC
  Administered 2021-08-24: 5 mg via ORAL
  Filled 2021-08-24: qty 1

## 2021-08-24 NOTE — Progress Notes (Signed)
Manufacturing engineer (ACC)   Met with patient and PMT at the bedside.  Ms. Trochez is alert and oriented. Was able to place her order for lunch. Appeared to have eaten 30-40% of her breakfast.  Initially, she was approved for hospice home. However, ACC MD felt we should reassess in light of her steady intake (albeit decreased) and mentation.  Today, we do not have a bed to offer Ms. Caven and eligibility is now pending for residential hospice.  Contacted her DIL [name], provided update, and that we cannot offer a bed today. We will reassess tomorrow to see if there has been further decline. Discussed if we did accept her and she improved or appeared that she was no loner < 2 weeks, that the process for placement from our [facility] can be arduous and will likely require paying out of pocket for a LTC facility, since she does not have medicaid. [name] was agreeable to reassess tomorrow.   Updated care team via Epic chat.  ACC will continue to follow and will update hospital team, and family once determination has been made for residential hospice.  Thank you, Venia Carbon DNP, RN [facility] Liaison

## 2021-08-24 NOTE — TOC Progression Note (Signed)
Transition of Care [facility]) - Progression Note    Patient Details  Name: Brandi Aguirre MRN: [id number] Date of Birth: Sep 04, 1921  Transition of Care [facility]) CM/SW Contact  Laurena Slimmer, RN Phone Number: 08/24/2021, 2:05 PM  Clinical Narrative:    Patient requesting [facility]. Hospice home will reassess for bed offer 5/30 per [facility] rep Venia Carbon.   Expected Discharge Plan: Skilled Nursing Facility Barriers to Discharge: Continued Medical Work up  Expected Discharge Plan and Services Expected Discharge Plan: [place]                                               Social Determinants of Health (SDOH) Interventions    Readmission Risk Interventions     View : No data to display.

## 2021-08-24 NOTE — Progress Notes (Signed)
Daily Progress Note   Patient Name: Brandi Aguirre       Date: 08/24/2021 DOB: 28-Nov-1921  Age: 88 y.o. MRN#: [id number] Attending Physician: Brandi Hanlon, MD Primary Care Physician: Brandi Castle, MD Admit Date: 08/19/2021 Length of Stay: 5 days  Reason for Consultation/Follow-up: Establishing goals of care  HPI/Patient Profile:  88 y.o. female  with past medical history of A-fib on coumadin, hypertension , hard of hearing, PPM placement was brought by EMS after she called her daughter in law this am stating she is sob and cold. Patient found in heart failure and oxygen saturations of 80% 1% on room air on presentation secondary to systolic heart failure and volume overload, elevated BNP she was admitted on 08/19/2021 with acute respiratory failure with hypoxia, acute on chronic systolic heart failure, elevated troponins, A-fib, and others.   Current treatment plan includes diuretics, strict I&O's, daily weights.  PMT was consulted for goals of care conversation given substantial chronic disease burden and advancing age.  Subjective:   Subjective: Chart Reviewed. Updates received. Patient Assessed. Created space and opportunity for patient  and family to explore thoughts and feelings regarding current medical situation.  Today's Discussion: I met with the patient at the bedside. I was joined by Brandi Carbon, RN from [facility].  I had a discussion with the patient using a white board given her significant hard of hearing.  I reviewed information from the weekend where the patient and her daughter-in-law felt that she was too weak to go to rehab, that the patient is "tired of doing all of this" and "not wanting to live" due to her medical conditions.  She confirms this.  Today she states she is not having any pain, significant nausea or vomiting.  She did order lunch, but review of her flowsheets indicates she typically only eats 10 to 20% of meals.  She continues to appear  profoundly weak.  Patient and daughter-in-law are on board with residential hospice placement.  It appears patient was previously approved for residential hospice, but at this point there are possible second thoughts from hospice on whether she is appropriate for residential care.  See assessment and plan below for further details.  I provided emotional general support through therapeutic listening, empathy, sharing stories, and other techniques.  I answered her questions and addressed all concerns to the best of my ability.  Review of Systems  Constitutional:  Positive for fatigue.  Respiratory:  Positive for shortness of breath (with ambulation). Negative for cough.   Cardiovascular:  Negative for chest pain.  Gastrointestinal:  Negative for abdominal pain, nausea and vomiting.  Neurological:  Positive for weakness.   Objective:   Vital Signs:  BP 133/66   Pulse (!) 101   Temp 98.2 F (36.8 C)   Resp 19   Ht _0  (1.651 m)   Wt 50.2 kg   SpO2 95%   BMI 18.42 kg/m   Physical Exam: Physical Exam Vitals and nursing note reviewed.  Constitutional:      General: She is not in acute distress.    Appearance: She is ill-appearing.  HENT:     Head: Normocephalic and atraumatic.     Ears:     Comments: Hard of hearing Pulmonary:     Effort: Pulmonary effort is normal. No respiratory distress.  Abdominal:     General: Abdomen is flat.     Palpations: Abdomen is soft.  Skin:    General: Skin is warm and dry.  Neurological:  Mental Status: She is alert.  Psychiatric:        Mood and Affect: Mood normal.        Behavior: Behavior normal.    Palliative Assessment/Data: 30-40%   Assessment & Plan:   Impression: Present on Admission: **None**  88 year old female with advancing age, multiple chronic comorbidities, noted decline at home and loss of interest in things that typically brought her joy.  She is admitted now with acute heart failure with hypoxia, A-fib.  Over  the weekend she has had significant change in that she does not want to continue "doing this" and this requested residential hospice.  Given her multiple chronic illnesses, 2 admissions and ED visits since October 2022 I feel this is appropriate.  It is clear the patient and family prefer residential hospice to rehab.  In my opinion, given her significant decompensated state despite ongoing home treatments, if she elects hospice and stops her medications that she will likely have a significant decline shortly related to A-fib, CHF, respiratory status.  SUMMARY OF RECOMMENDATIONS   Remain DNR Continue to engage with [facility] on hospice placement Continued patient and family support PMT will continue to follow  Symptom Management:  Per primary team PMT is available to assist as needed  Code Status: DNR  Prognosis: Unable to determine  Discharge Planning: To Be Determined  Discussed with: Patient, patient's family, TOC team, medical team, nursing team, PT, OT  Thank you for allowing [place] to participate in the care of Brandi Aguirre PMT will continue to support holistically.  Billing based on MDM: High  Problems Addressed: One acute or chronic illness or injury that poses a threat to life or bodily function  Amount and/or Complexity of Data: Category 1:Review of prior external note(s) from each unique source and Review of the result(s) of each unique test and Category 3:Discussion of management or test interpretation with external physician/other qualified health care professional/appropriate source (not separately reported) (reviewed today's CBC, INR; reviewed previous cardiology and family medicine notes to understand chronic status of her health)  Risks: Decision not to resuscitate or to de-escalate care because of poor prognosis   Brandi Field, NP Palliative Medicine Team  Team Phone # [phone] (Nights/Weekends)  11/25/2020, 8:17 AM

## 2021-08-24 NOTE — Progress Notes (Signed)
61 yof DNR with impaired hearing bilaterally. She is alert and oriented this shift. No complaints of pain. She is able to ambulate to the restroom with assistance. Calm and cooperative.

## 2021-08-24 NOTE — Progress Notes (Signed)
[place] for continuation of Warfarin Indication: atrial fibrillation  Allergies  Allergen Reactions   Sulfa Antibiotics Rash    Patient Measurements: Height: 5\' 5"  (165.1 cm) Weight: 50.2 kg (110 lb 10.7 oz) IBW/kg (Calculated) : 57 Heparin Dosing Weight:    Vital Signs: Temp: 98.2 F (36.8 C) (05/29 0814) Temp Source: Oral (05/29 0438) BP: 133/66 (05/29 0814) Pulse Rate: 101 (05/29 0814)  Labs: Recent Labs    08/22/21 0441 08/23/21 0336 08/24/21 0521  HGB  --   --  12.4  HCT  --   --  39.2  PLT  --   --  121*  LABPROT 20.3* 19.9* 17.3*  INR 1.8* 1.7* 1.4*  CREATININE 1.04* 0.97  --      Estimated Creatinine Clearance: 25.7 mL/min (by C-G formula based on SCr of 0.97 mg/dL).   Medical History: Past Medical History:  Diagnosis Date   Atrial fibrillation [facility])    Cochlear implant in place    Hypertension    Presence of permanent cardiac pacemaker    Sick sinus syndrome ([place])    Wears dentures     Medications:  PTA Warfarin: 5mg  on Thursday, 3mg  all other days  Assessment: Patient is a 88yo female with a history of afib. She is anticoagulated on warfarin. Pharmacy consulted to manage warfarin dosing. Per anticoagulation notes, pt's dose was increase 8-10% but notes not clear on what dose the patient was prescribed.   Date INR Warfarin Dose  5/24 2.1 3 mg  5/25 1.6 5 mg   5/26 1.8 3 mg  5/27 1.8 3 mg  5/28 1.7 5 mg  5/29 1.4         Goal of Therapy:  INR 2-3 Monitor platelets by anticoagulation protocol: Yes   Plan:  INR is subtherapeutic. Will give warfarin 5 mg x 1 today.  Daily INR. CBC at least every 3 days.   Noralee Space, PharmD Clinical Pharmacist 08/24/2021 10:24 AM

## 2021-08-24 NOTE — Progress Notes (Addendum)
PROGRESS NOTE    Brandi PoagFrances Zarcone  [id number] DOB: July 17, 1921 DOA: 08/19/2021 PCP: Dione Housekeeperlmedo, Mario Ernesto, MD    Brief Narrative:  Brandi Aguirre is a 88 y.o. female with medical history significant of history of A-fib on coumadin, hypertension , hard of hearing, PPM placement was brought by EMS after she called her daughter in law this am stating she is sob and cold Patient found in heart failure   PT OT recommended SNF  5/26 feeling better, less sob. Eating breakfast. Palliative consulted, and meeting family 5/27 spoke to pt this am and then with daughter-in-law.  Daughter-in-law and patient feel that she is too weak to be going to rehab.  Patient had expressed not wanting to live due to her medical conditions and she is tired of going through this.  Spoke to daughter-in-law about hospice care and she is agreeable for them to evaluate patient for hospice house 5/29 on hospice.  Today we will need to reassess tomorrow to see if any further decline  Consultants:  Cardiology, palliative  Procedures:   Antimicrobials:      Subjective: Patient has no new complaints.  Feels shortness of breath at baseline  Objective: Vitals:   08/23/21 1705 08/23/21 1944 08/24/21 0000 08/24/21 0438  BP: 125/70 140/71 132/80 140/84  Pulse: 82 82 85 72  Resp:  18 20 19   Temp: 98.2 F (36.8 C) 98.7 F (37.1 C) 98.2 F (36.8 C) 98.1 F (36.7 C)  TempSrc:  Oral Oral Oral  SpO2: 95% 96% 98% 97%  Weight:    50.2 kg  Height:        Intake/Output Summary (Last 24 hours) at 08/24/2021 0807 Last data filed at 08/23/2021 1904 Gross per 24 hour  Intake 600 ml  Output 375 ml  Net 225 ml   Filed Weights   08/22/21 0540 08/23/21 0430 08/24/21 0438  Weight: 50.7 kg 49.8 kg 50.2 kg    Examination: Calm, NAD Cta no w/r Reg s1/s2 no gallop Soft benign +bs No edema Aaoxox3  Mood and affect appropriate in current setting     Data Reviewed: I have personally reviewed following labs and  imaging studies  CBC: Recent Labs  Lab 08/19/21 0813 08/20/21 0510 08/21/21 0536 08/24/21 0521  WBC 12.4* 6.3  --  6.9  HGB 13.6 11.9*  --  12.4  HCT 43.3 37.8  --  39.2  MCV 95.2 95.5  --  94.9  PLT 136* 118* 126* 121*   Basic Metabolic Panel: Recent Labs  Lab 08/19/21 0813 08/20/21 0510 08/21/21 0536 08/22/21 0441 08/23/21 0336  NA 140 141 141  --   --   K 4.1 3.8 3.7 4.0  --   CL 104 105 104  --   --   CO2 27 28 30   --   --   GLUCOSE 140* 86 85  --   --   BUN 16 23 27*  --   --   CREATININE 0.95 1.14* 0.97 1.04* 0.97  CALCIUM 8.8* 8.4* 8.4*  --   --    GFR: Estimated Creatinine Clearance: 25.7 mL/min (by C-G formula based on SCr of 0.97 mg/dL). Liver Function Tests: Recent Labs  Lab 08/19/21 0813  AST 19  ALT 10  ALKPHOS 68  BILITOT 0.8  PROT 6.7  ALBUMIN 3.5   No results for input(s): LIPASE, AMYLASE in the last 168 hours. No results for input(s): AMMONIA in the last 168 hours. Coagulation Profile: Recent Labs  Lab 08/20/21 0510 08/21/21  6294 08/22/21 0441 08/23/21 0336 08/24/21 0521  INR 1.6* 1.8* 1.8* 1.7* 1.4*   Cardiac Enzymes: No results for input(s): CKTOTAL, CKMB, CKMBINDEX, TROPONINI in the last 168 hours. BNP (last 3 results) No results for input(s): PROBNP in the last 8760 hours. HbA1C: No results for input(s): HGBA1C in the last 72 hours. CBG: No results for input(s): GLUCAP in the last 168 hours. Lipid Profile: No results for input(s): CHOL, HDL, LDLCALC, TRIG, CHOLHDL, LDLDIRECT in the last 72 hours. Thyroid Function Tests: No results for input(s): TSH, T4TOTAL, FREET4, T3FREE, THYROIDAB in the last 72 hours. Anemia Panel: No results for input(s): VITAMINB12, FOLATE, FERRITIN, TIBC, IRON, RETICCTPCT in the last 72 hours. Sepsis Labs: No results for input(s): PROCALCITON, LATICACIDVEN in the last 168 hours.  No results found for this or any previous visit (from the past 240 hour(s)).       Radiology Studies: No results  found.      Scheduled Meds:  aspirin  81 mg Oral Daily   feeding supplement  237 mL Oral TID BM   furosemide  20 mg Oral Daily   metoprolol succinate  12.5 mg Oral Daily   multivitamin with minerals  1 tablet Oral Daily   sodium chloride flush  3 mL Intravenous Q12H   traZODone  100 mg Oral QHS   Warfarin - Pharmacist Dosing Inpatient   Does not apply q1600   Continuous Infusions:  sodium chloride      Assessment & Plan:   Principal Problem:   CHF (congestive heart failure) (HCC) Active Problems:   HOH (hard of hearing)   Acute on chronic systolic CHF (congestive heart failure) (HCC)   Leukocytosis   Elevated troponin   Essential hypertension   Acute respiratory failure with hypoxia (HCC)   PAF (paroxysmal atrial fibrillation) (HCC)   AKI (acute kidney injury) (HCC)   Goals of care, counseling/discussion   Palliative care by specialist   DNR (do not resuscitate)   Protein-calorie malnutrition, severe   Acute respiratory failure with hypoxia Presented with shortness of breath and hypoxia to 87% on room air  2/2 acute systolic heart failure and volume overload BNP elevated Echo from 2017 EF 45 to 50% Echo here nml EF, diastolic function indeterminant 5/29 clinically improved  Continue Lasix low-dose           2.  Acute on chronic systolic heart failure Echo as above  clinically has improved More euvolemic Continue metoprolol 5/29 continue Lasix        3. Elevated TP Likely due to demand ischemia from above Echo today with normal EF and no regional wall motion abnormality Cardiology following Was given asa 325mg , then 81mg  therafter. On  beta-blockers 5/29 no chest pain  No ischemic evaluation    4. AKI Mildly increased likely from diuresis 5/29 renal function at baseline    5.PAF S/PPPM NSVT  Echo nml ef and no wma 5/27 had A-fib RVR early this morning Continue beta-blockers increase as tolerated On Coumadin dosing per pharmacy    6. Essential HTN Stable.  Continue current management.   7. Goals of care Palliative on board, meeting with family set up. 5/27 plan for hospice to evaluate patient , discussed with daughter in law . Patient and daughter 6/27 think she can handle rehab. Patient expressed tired of her illness and doesn't want to return to the hospital again Being evaluated by hospice in a.m.    DVT prophylaxis: On Coumadin Code Status: DNR Family Communication: None at  bedside Disposition Plan:  Status is: Inpatient Remains inpatient appropriate because: Unsafe discharge.  Awaiting hospice house bed pending/evaluation        LOS: 5 days   Time spent: 25 min    Lynn Ito, MD [facility] Pager [phone]  If 7PM-7AM, please contact night-coverage 08/24/2021, 8:07 AM

## 2021-08-25 DIAGNOSIS — J9601 Acute respiratory failure with hypoxia: Secondary | ICD-10-CM | POA: Diagnosis not present

## 2021-08-25 DIAGNOSIS — N179 Acute kidney failure, unspecified: Secondary | ICD-10-CM | POA: Diagnosis not present

## 2021-08-25 DIAGNOSIS — Z515 Encounter for palliative care: Secondary | ICD-10-CM | POA: Diagnosis not present

## 2021-08-25 DIAGNOSIS — Z66 Do not resuscitate: Secondary | ICD-10-CM | POA: Diagnosis not present

## 2021-08-25 DIAGNOSIS — Z7189 Other specified counseling: Secondary | ICD-10-CM | POA: Diagnosis not present

## 2021-08-25 DIAGNOSIS — I5023 Acute on chronic systolic (congestive) heart failure: Secondary | ICD-10-CM | POA: Diagnosis not present

## 2021-08-25 LAB — PROTIME-INR
INR: 1.6 — ABNORMAL HIGH (ref 0.8–1.2)
Prothrombin Time: 18.7 seconds — ABNORMAL HIGH (ref 11.4–15.2)

## 2021-08-25 MED ORDER — WARFARIN SODIUM 5 MG PO TABS: 5.0000 mg | ORAL_TABLET | Freq: Once | ORAL | Status: AC

## 2021-08-25 MED ORDER — METOPROLOL SUCCINATE ER 25 MG PO TB24: 12.5000 mg | ORAL_TABLET | Freq: Every day | ORAL | Status: AC

## 2021-08-25 MED ORDER — WARFARIN SODIUM 5 MG PO TABS
5.0000 mg | ORAL_TABLET | Freq: Once | ORAL | Status: AC
  Administered 2021-08-25: 5 mg via ORAL
  Filled 2021-08-25: qty 1

## 2021-08-25 MED ORDER — ENSURE ENLIVE PO LIQD: 237.0000 mL | Freq: Three times a day (TID) | ORAL | 12 refills | Status: AC

## 2021-08-25 MED ORDER — ASPIRIN 81 MG PO CHEW: 81.0000 mg | CHEWABLE_TABLET | Freq: Every day | ORAL | Status: AC

## 2021-08-25 NOTE — Discharge Summary (Signed)
Brandi PoagFrances Aguirre VWU:[id number] DOB: 1922/02/14 DOA: 08/19/2021  PCP: Dione Housekeeperlmedo, Mario Ernesto, MD  Admit date: 08/19/2021 Discharge date: 08/25/2021  Admitted From: Home Disposition: Hospice facility   Discharge Condition:Stable CODE STATUS: DNR Diet recommendation: Heart Healthy Brief/Interim Summary: Per HPI  Brandi Aguirre is a 88 y.o. female with medical history significant of history of A-fib on coumadin, hypertension , hard of hearing, PPM placement was brought by EMS after she called her daughter in law this am stating she is sob and cold Patient found in heart failure she was treated with diuretics.  She is currently euvolemic.  Hospice was consulted and the facility has a bed.  She will be discharged to hospice facility today   Acute respiratory failure with hypoxia Presented with shortness of breath and hypoxia to 87% on room air  acute systolic heart failure and volume overload BNP elevated Echo from 2017 EF 45 to 50% Echo here nml EF, diastolic function indeterminant  clinically improved and at baseline  Continue Lasix low-dose, beta-blocker         2.  Acute on chronic systolic heart failure Echo as above More euvolemic and clinically improved  Continue Lasix and beta-blockers               3. Elevated TP Likely due to demand ischemia from above Echo today with normal EF and no regional wall motion abnormality Cardiology following Was given asa 325mg , then 81mg  therafter. On  beta-blockers no chest pain. No plans for ischemic evaluation Patient going to hospice facility       4. AKI Mildly increased likely from diuresis  renal function at baseline          5.PAF S/PPPM NSVT  Echo nml ef and no wma had A-fib RVR  Continue beta-blockers  On Coumadin    6. Essential HTN Stable.  Continue current management.    Discharge Diagnoses:  Principal Problem:   CHF (congestive heart failure) (HCC) Active Problems:   HOH (hard of hearing)   Acute  on chronic systolic CHF (congestive heart failure) (HCC)   Leukocytosis   Elevated troponin   Essential hypertension   Acute respiratory failure with hypoxia (HCC)   PAF (paroxysmal atrial fibrillation) (HCC)   AKI (acute kidney injury) (HCC)   Goals of care, counseling/discussion   Palliative care by specialist   DNR (do not resuscitate)   Protein-calorie malnutrition, severe    Discharge Instructions  Discharge Instructions     Diet - low sodium heart healthy   Complete by: As directed    Increase activity slowly   Complete by: As directed       Allergies as of 08/25/2021       Reactions   Sulfa Antibiotics Rash        Medication List     STOP taking these medications    diltiazem 120 MG tablet Commonly known as: CARDIZEM   erythromycin ophthalmic ointment   hydrALAZINE 10 MG tablet Commonly known as: APRESOLINE   isosorbide mononitrate 60 MG 24 hr tablet Commonly known as: IMDUR   lisinopril 2.5 MG tablet Commonly known as: ZESTRIL   metoprolol tartrate 50 MG tablet Commonly known as: LOPRESSOR       TAKE these medications    aspirin 81 MG chewable tablet Chew 1 tablet (81 mg total) by mouth daily. Start taking on: Aug 26, 2021   feeding supplement Liqd Take 237 mLs by mouth 3 (three) times daily between meals.   furosemide 20 MG tablet  Commonly known as: LASIX Take 1 tablet (20 mg total) by mouth daily.   metoprolol succinate 25 MG 24 hr tablet Commonly known as: TOPROL-XL Take 0.5 tablets (12.5 mg total) by mouth daily. Start taking on: Aug 26, 2021   traZODone 100 MG tablet Commonly known as: DESYREL Take 100 mg by mouth at bedtime.   warfarin 5 MG tablet Commonly known as: COUMADIN Take 1 tablet (5 mg total) by mouth one time only at 4 PM. What changed:  medication strength how much to take when to take this Another medication with the same name was removed. Continue taking this medication, and follow the directions you see  here.        Allergies  Allergen Reactions   Sulfa Antibiotics Rash    Consultations: Cardiology   Procedures/Studies: DG Chest Portable 1 View  Result Date: 08/19/2021 CLINICAL DATA:  Shortness of breath, hypoxemia, cough, atrial fibrillation EXAM: PORTABLE CHEST 1 VIEW COMPARISON:  Portable exam 0820 hours compared to 07/20/2021 FINDINGS: LEFT subclavian pacemaker leads project over RIGHT atrium and RIGHT ventricle. Upper normal heart size with pulmonary vascular congestion. Atherosclerotic calcification aorta. Scattered interstitial infiltrates bilaterally, favor pulmonary edema. No pleural effusion or pneumothorax. Bones demineralized. IMPRESSION: Pulmonary vascular congestion with new interstitial infiltrates question pulmonary edema. Aortic Atherosclerosis (ICD10-I70.0). Electronically Signed   By: Ulyses Southward M.D.   On: 08/19/2021 08:28   ECHOCARDIOGRAM COMPLETE  Result Date: 08/19/2021    ECHOCARDIOGRAM REPORT   Patient Name:   Brandi Aguirre Date of Exam: 08/19/2021 Medical Rec #:  [id number]      Height:       65.0 in Accession #:    [id number]     Weight:       134.5 lb Date of Birth:  1922/03/27      BSA:          1.671 m Patient Age:    88 years       BP:           92/45 mmHg Patient Gender: F              HR:           83 bpm. Exam Location:  [facility] Procedure: 2D Echo, Color Doppler and Cardiac Doppler Indications:     I50.21 congestive heart failure-Acute Systolic  History:         Patient has prior history of Echocardiogram examinations.                  Pacemaker, Arrythmias:Atrial Fibrillation; Risk                  Factors:Hypertension.  Sonographer:     Humphrey Rolls Referring Phys:  [id number] [facility] Ott Zimmerle Diagnosing Phys: Sena Slate IMPRESSIONS  1. Left ventricular ejection fraction, by estimation, is 60 to 65%. The left ventricle has normal function. The left ventricle has no regional wall motion abnormalities. There is mild left ventricular hypertrophy. Left ventricular diastolic  parameters are indeterminate.  2. Right ventricular systolic function is normal. The right ventricular size is normal.  3. Left atrial size was mildly dilated.  4. The mitral valve is degenerative. Trivial mitral valve regurgitation. No evidence of mitral stenosis.  5. Tricuspid valve regurgitation is moderate.  6. The aortic valve is normal in structure. Aortic valve regurgitation is not visualized. Aortic valve sclerosis is present, with no evidence of aortic valve stenosis. FINDINGS  Left Ventricle: Left ventricular ejection fraction, by estimation, is 60 to 65%.  The left ventricle has normal function. The left ventricle has no regional wall motion abnormalities. The left ventricular internal cavity size was normal in size. There is  mild left ventricular hypertrophy. Left ventricular diastolic parameters are indeterminate. Right Ventricle: The right ventricular size is normal. Right vetricular wall thickness was not well visualized. Right ventricular systolic function is normal. Left Atrium: Left atrial size was mildly dilated. Right Atrium: Right atrial size was normal in size. Pericardium: There is no evidence of pericardial effusion. Mitral Valve: The mitral valve is degenerative in appearance. Trivial mitral valve regurgitation. No evidence of mitral valve stenosis. MV peak gradient, 6.4 mmHg. The mean mitral valve gradient is 2.0 mmHg. Tricuspid Valve: The tricuspid valve is normal in structure. Tricuspid valve regurgitation is moderate. Aortic Valve: The aortic valve is normal in structure. Aortic valve regurgitation is not visualized. Aortic valve sclerosis is present, with no evidence of aortic valve stenosis. Aortic valve mean gradient measures 4.0 mmHg. Aortic valve peak gradient measures 7.2 mmHg. Aortic valve area, by VTI measures 1.49 cm. Pulmonic Valve: The pulmonic valve was not well visualized. Aorta: The aortic root is normal in size and structure. Venous: The inferior vena cava was not well  visualized. IAS/Shunts: No atrial level shunt detected by color flow Doppler. Additional Comments: A device lead is visualized.  LEFT VENTRICLE PLAX 2D LVIDd:         3.66 cm   Diastology LVIDs:         2.68 cm   LV e' medial:    6.42 cm/s LV PW:         1.15 cm   LV E/e' medial:  18.2 LV IVS:        0.83 cm   LV e' lateral:   5.55 cm/s LVOT diam:     2.10 cm   LV E/e' lateral: 21.1 LV SV:         29 LV SV Index:   18 LVOT Area:     3.46 cm  RIGHT VENTRICLE RV Basal diam:  2.77 cm LEFT ATRIUM             Index        RIGHT ATRIUM          Index LA diam:        3.90 cm 2.33 cm/m   RA Area:     7.90 cm LA Vol (A2C):   81.5 ml 48.77 ml/m  RA Volume:   11.80 ml 7.06 ml/m LA Vol (A4C):   45.0 ml 26.93 ml/m LA Biplane Vol: 66.6 ml 39.85 ml/m  AORTIC VALVE                    PULMONIC VALVE AV Area (Vmax):    1.57 cm     PV Vmax:       0.93 m/s AV Area (Vmean):   1.48 cm     PV Vmean:      65.800 cm/s AV Area (VTI):     1.49 cm     PV VTI:        0.153 m AV Vmax:           134.00 cm/s  PV Peak grad:  3.5 mmHg AV Vmean:          89.800 cm/s  PV Mean grad:  2.0 mmHg AV VTI:            0.197 m AV Peak Grad:      7.2 mmHg  AV Mean Grad:      4.0 mmHg LVOT Vmax:         60.70 cm/s LVOT Vmean:        38.400 cm/s LVOT VTI:          0.085 m LVOT/AV VTI ratio: 0.43  AORTA Ao Root diam: 3.50 cm MITRAL VALVE                TRICUSPID VALVE MV Area (PHT): 3.36 cm     TR Peak grad:   30.7 mmHg MV Area VTI:   1.07 cm     TR Vmax:        277.00 cm/s MV Peak grad:  6.4 mmHg MV Mean grad:  2.0 mmHg     SHUNTS MV Vmax:       1.26 m/s     Systemic VTI:  0.08 m MV Vmean:      68.1 cm/s    Systemic Diam: 2.10 cm MV Decel Time: 226 msec MV E velocity: 117.00 cm/s Sena Slate Electronically signed by Sena Slate Signature Date/Time: 08/19/2021/4:56:02 PM    Final       Subjective: No shortness of breath or  cp   Discharge Exam: Vitals:   08/25/21 0852 08/25/21 1148  BP: (!) 133/53 130/82  Pulse: (!) 51 82  Resp: 16 20  Temp:  (!) 97.5 F (36.4 C) 97.8 F (36.6 C)  SpO2: 99% 98%   Vitals:   08/25/21 0326 08/25/21 0456 08/25/21 0852 08/25/21 1148  BP: (!) 151/65  (!) 133/53 130/82  Pulse: (!) 58  (!) 51 82  Resp: Temp: 97.6 F (36.4 C)  (!) 97.5 F (36.4 C) 97.8 F (36.6 C)  TempSrc:   Oral   SpO2: 98%  99% 98%  Weight:  51.3 kg    Height:        General: Pt is alert, awake, not in acute distress Cardiovascular: RRR, S1/S2 +, no rubs, no gallops Respiratory: CTA bilaterally, no wheezing, no rhonchi Abdominal: Soft, NT, ND, bowel sounds + Extremities: no edema, no cyanosis    The results of significant diagnostics from this hospitalization (including imaging, microbiology, ancillary and laboratory) are listed below for reference.     Microbiology: No results found for this or any previous visit (from the past 240 hour(s)).   Labs: BNP (last 3 results) Recent Labs    08/19/21 0813  BNP 646.9*   Basic Metabolic Panel: Recent Labs  Lab 08/19/21 0813 08/20/21 0510 08/21/21 0536 08/22/21 0441 08/23/21 0336  NA 140 141 141  --   --   K 4.1 3.8 3.7 4.0  --   CL 104 105 104  --   --   CO2 --   --   GLUCOSE 140* 86 85  --   --   BUN 16 23 27*  --   --   CREATININE 0.95 1.14* 0.97 1.04* 0.97  CALCIUM 8.8* 8.4* 8.4*  --   --    Liver Function Tests: Recent Labs  Lab 08/19/21 0813  AST 19  ALT 10  ALKPHOS 68  BILITOT 0.8  PROT 6.7  ALBUMIN 3.5   No results for input(s): LIPASE, AMYLASE in the last 168 hours. No results for input(s): AMMONIA in the last 168 hours. CBC: Recent Labs  Lab 08/19/21 0813 08/20/21 0510 08/21/21 0536 08/24/21 0521  WBC 12.4* 6.3  --  6.9  HGB 13.6 11.9*  --  12.4  HCT 43.3 37.8  --  39.2  MCV 95.2 95.5  --  94.9  PLT 136* 118* 126* 121*   Cardiac Enzymes: No results for input(s): CKTOTAL, CKMB, CKMBINDEX, TROPONINI in the last 168 hours. BNP: Invalid input(s): POCBNP CBG: No results for input(s): GLUCAP in the last 168  hours. D-Dimer No results for input(s): DDIMER in the last 72 hours. Hgb A1c No results for input(s): HGBA1C in the last 72 hours. Lipid Profile No results for input(s): CHOL, HDL, LDLCALC, TRIG, CHOLHDL, LDLDIRECT in the last 72 hours. Thyroid function studies No results for input(s): TSH, T4TOTAL, T3FREE, THYROIDAB in the last 72 hours.  Invalid input(s): FREET3 Anemia work up No results for input(s): VITAMINB12, FOLATE, FERRITIN, TIBC, IRON, RETICCTPCT in the last 72 hours. Urinalysis    Component Value Date/Time   COLORURINE STRAW (A) 07/20/2021 1209   APPEARANCEUR CLEAR (A) 07/20/2021 1209   APPEARANCEUR Hazy 05/06/2013 1923   LABSPEC 1.004 (L) 07/20/2021 1209   LABSPEC 1.012 05/06/2013 1923   PHURINE 8.0 07/20/2021 1209   GLUCOSEU NEGATIVE 07/20/2021 1209   GLUCOSEU Negative 05/06/2013 1923   HGBUR SMALL (A) 07/20/2021 1209   BILIRUBINUR NEGATIVE 07/20/2021 1209   BILIRUBINUR Negative 05/06/2013 1923   KETONESUR NEGATIVE 07/20/2021 1209   PROTEINUR 100 (A) 07/20/2021 1209   NITRITE POSITIVE (A) 07/20/2021 1209   LEUKOCYTESUR NEGATIVE 07/20/2021 1209   LEUKOCYTESUR Trace 05/06/2013 1923   Sepsis Labs Invalid input(s): PROCALCITONIN,  WBC,  LACTICIDVEN Microbiology No results found for this or any previous visit (from the past 240 hour(s)).   Time coordinating discharge: Over 30 minutes  SIGNED:   Lynn Ito, MD  [facility] 08/25/2021, 1:10 PM Pager   If 7PM-7AM, please contact night-coverage [email or web address] Password [email or web address]

## 2021-08-25 NOTE — Progress Notes (Signed)
Patient was discharged from the floor at 2100 (9pm). Patient was alert and oriented. EMS picked her up and verbal report was given to them. Day shift nurse gave receiving report to receiving nurse at [facility], [place] (RN).  Phone number is [phone].   IV left in place when patient was discharged. It was clean, dry and intact.  Vital signs on discharge below:  08/25/21 0852  Vitals  Temp (!) 97.5 F (36.4 C)  Temp Source Oral  BP (!) 133/53  MAP (mmHg) 76  BP Location Left Arm  BP Method Automatic  Patient Position (if appropriate) Lying  Pulse Rate (!) 51  Pulse Rate Source Monitor  Resp 16  MEWS COLOR  MEWS Score Color Green  Oxygen Therapy  SpO2 99 %  O2 Device Nasal Cannula  O2 Flow Rate (L/min) 3 L/min  Pain Assessment  Pain Scale 0-10  Pain Score 0  MEWS Score  MEWS Temp 0  MEWS Systolic 0  MEWS Pulse 0  MEWS RR 0  MEWS LOC 0  MEWS Score 0

## 2021-08-25 NOTE — Progress Notes (Addendum)
PROGRESS NOTE    Brandi Aguirre  [id number] DOB: November 17, 1921 DOA: 08/19/2021 PCP: Valera Castle, MD    Brief Narrative:  Brandi Aguirre is a 88 y.o. female with medical history significant of history of A-fib on coumadin, hypertension , hard of hearing, PPM placement was brought by EMS after she called her daughter in law this am stating she is sob and cold Patient found in heart failure.  Now she is euvolemic.  Hospice was consulted and we are waiting for hospice bed.  PT OT has seen the patient during hospitalization and recommended SNF. Hospice will evaluate patient tomorrow to decide if going to SNF    Consultants:  Cardiology, palliative  Procedures:   Antimicrobials:      Subjective: No complaints this AM.  Shortness of breath has improved and she states she is at baseline.  No chest pain  Objective: Vitals:   08/24/21 2350 08/25/21 0326 08/25/21 0456 08/25/21 0852  BP: 128/64 (!) 151/65  (!) 133/53  Pulse: 90 (!) 58  (!) 51  Resp: 17 18  16   Temp: 98.5 F (36.9 C) 97.6 F (36.4 C)  (!) 97.5 F (36.4 C)  TempSrc: Oral   Oral  SpO2: 96% 98%  99%  Weight:   51.3 kg   Height:        Intake/Output Summary (Last 24 hours) at 08/25/2021 0857 Last data filed at 08/25/2021 0500 Gross per 24 hour  Intake 240 ml  Output 550 ml  Net -310 ml   Filed Weights   08/23/21 0430 08/24/21 0438 08/25/21 0456  Weight: 49.8 kg 50.2 kg 51.3 kg    Examination: Calm, NAD, eating breakfast Cta no w/r Reg s1/s2 no gallop Soft benign +bs No edema Awake and alert Mood and affect appropriate in current setting     Data Reviewed: I have personally reviewed following labs and imaging studies  CBC: Recent Labs  Lab 08/19/21 0813 08/20/21 0510 08/21/21 0536 08/24/21 0521  WBC 12.4* 6.3  --  6.9  HGB 13.6 11.9*  --  12.4  HCT 43.3 37.8  --  39.2  MCV 95.2 95.5  --  94.9  PLT 136* 118* 126* [id number]*   Basic Metabolic Panel: Recent Labs  Lab 08/19/21 0813  08/20/21 0510 08/21/21 0536 08/22/21 0441 08/23/21 0336  NA 140 141 141  --   --   K 4.1 3.8 3.7 4.0  --   CL 104 105 104  --   --   CO2 27 28 30   --   --   GLUCOSE 140* 86 85  --   --   BUN 16 23 27*  --   --   CREATININE 0.95 1.14* 0.97 1.04* 0.97  CALCIUM 8.8* 8.4* 8.4*  --   --    GFR: Estimated Creatinine Clearance: 26.2 mL/min (by C-G formula based on SCr of 0.97 mg/dL). Liver Function Tests: Recent Labs  Lab 08/19/21 0813  AST 19  ALT 10  ALKPHOS 68  BILITOT 0.8  PROT 6.7  ALBUMIN 3.5   No results for input(s): LIPASE, AMYLASE in the last 168 hours. No results for input(s): AMMONIA in the last 168 hours. Coagulation Profile: Recent Labs  Lab 08/21/21 0536 08/22/21 0441 08/23/21 0336 08/24/21 0521 08/25/21 0621  INR 1.8* 1.8* 1.7* 1.4* 1.6*   Cardiac Enzymes: No results for input(s): CKTOTAL, CKMB, CKMBINDEX, TROPONINI in the last 168 hours. BNP (last 3 results) No results for input(s): PROBNP in the last 8760 hours. HbA1C:  No results for input(s): HGBA1C in the last 72 hours. CBG: No results for input(s): GLUCAP in the last 168 hours. Lipid Profile: No results for input(s): CHOL, HDL, LDLCALC, TRIG, CHOLHDL, LDLDIRECT in the last 72 hours. Thyroid Function Tests: No results for input(s): TSH, T4TOTAL, FREET4, T3FREE, THYROIDAB in the last 72 hours. Anemia Panel: No results for input(s): VITAMINB12, FOLATE, FERRITIN, TIBC, IRON, RETICCTPCT in the last 72 hours. Sepsis Labs: No results for input(s): PROCALCITON, LATICACIDVEN in the last 168 hours.  No results found for this or any previous visit (from the past 240 hour(s)).       Radiology Studies: No results found.      Scheduled Meds:  aspirin  81 mg Oral Daily   feeding supplement  237 mL Oral TID BM   furosemide  20 mg Oral Daily   metoprolol succinate  12.5 mg Oral Daily   multivitamin with minerals  1 tablet Oral Daily   sodium chloride flush  3 mL Intravenous Q12H   traZODone   100 mg Oral QHS   Warfarin - Pharmacist Dosing Inpatient   Does not apply q1600   Continuous Infusions:  sodium chloride      Assessment & Plan:   Principal Problem:   CHF (congestive heart failure) (HCC) Active Problems:   HOH (hard of hearing)   Acute on chronic systolic CHF (congestive heart failure) (HCC)   Leukocytosis   Elevated troponin   Essential hypertension   Acute respiratory failure with hypoxia (HCC)   PAF (paroxysmal atrial fibrillation) (HCC)   AKI (acute kidney injury) ([place])   Goals of care, counseling/discussion   Palliative care by specialist   DNR (do not resuscitate)   Protein-calorie malnutrition, severe   Acute respiratory failure with hypoxia Presented with shortness of breath and hypoxia to 87% on room air  2/2 acute systolic heart failure and volume overload BNP elevated Echo from 2017 EF 45 to 50% Echo here nml EF, diastolic function indeterminant 5/30 clinically improved and at baseline  Continue Lasix low-dose        2.  Acute on chronic systolic heart failure Echo as above More euvolemic and clinically improved  Continue Lasix and beta-blockers          3. Elevated TP Likely due to demand ischemia from above Echo today with normal EF and no regional wall motion abnormality Cardiology following Was given asa 325mg , then 81mg  therafter. On  beta-blockers 5/30 no chest pain. No plans for ischemic evaluation    4. AKI Mildly increased likely from diuresis 5/30 renal function at baseline       5.PAF S/PPPM NSVT  Echo nml ef and no wma 5/27 had A-fib RVR early this morning Continue beta-blockers increase as tolerated On Coumadin dosing per pharmacy   6. Essential HTN Stable.  Continue current management.   7. Goals of care Palliative on board, meeting with family set up. 5/27 plan for hospice to evaluate patient , discussed with daughter in law . Patient and daughter Brandi Aguirre think she can handle rehab. Patient  expressed tired of her illness and doesn't want to return to the hospital again Being evaluated by hospice in a.m.    DVT prophylaxis: On Coumadin Code Status: DNR Family Communication: None at bedside Disposition Plan:  Status is: Inpatient Remains inpatient appropriate because: Unsafe discharge.  Awaiting hospice house bed pending/evaluation in a.m.        LOS: 6 days   Time spent: 2min    Crimson Dubberly,  MD [facility] Pager [phone]  If 7PM-7AM, please contact night-coverage 08/25/2021, 8:57 AM

## 2021-08-25 NOTE — Progress Notes (Signed)
ANTICOAGULATION CONSULT NOTE   Pharmacy Consult for continuation of Warfarin Indication: atrial fibrillation  Allergies  Allergen Reactions   Sulfa Antibiotics Rash    Patient Measurements: Height: 5\' 5"  (165.1 cm) Weight: 51.3 kg (113 lb 1.5 oz) IBW/kg (Calculated) : 57 Heparin Dosing Weight:    Vital Signs: Temp: 97.5 F (36.4 C) (05/30 0852) Temp Source: Oral (05/30 0852) BP: 133/53 (05/30 0852) Pulse Rate: 51 (05/30 0852)  Labs: Recent Labs    08/23/21 0336 08/24/21 0521 08/25/21 0621  HGB  --  12.4  --   HCT  --  39.2  --   PLT  --  121*  --   LABPROT 19.9* 17.3* 18.7*  INR 1.7* 1.4* 1.6*  CREATININE 0.97  --   --      Estimated Creatinine Clearance: 26.2 mL/min (by C-G formula based on SCr of 0.97 mg/dL).   Medical History: Past Medical History:  Diagnosis Date   Atrial fibrillation [facility])    Cochlear implant in place    Hypertension    Presence of permanent cardiac pacemaker    Sick sinus syndrome (HCC)    Wears dentures     Medications:  PTA Warfarin: 5mg  on Thursday, 3mg  all other days  Assessment: Patient is a 88yo female with a history of afib. She is anticoagulated on warfarin. Pharmacy consulted to manage warfarin dosing. Per anticoagulation notes, pt's dose was increase 8-10% but notes not clear on what dose the patient was prescribed.   Date INR Warfarin Dose  5/24 2.1 3 mg  5/25 1.6 5 mg   5/26 1.8 3 mg  5/27 1.8 3 mg  5/28 1.7 5 mg  5/29 1.4 5 mg   5/30 1.6 5 mg     Goal of Therapy:  INR 2-3 Monitor platelets by anticoagulation protocol: Yes   Plan:  INR is subtherapeutic. Will give warfarin 5 mg x 1 today.  Daily INR. CBC at least every 3 days.   6/28, PharmD Clinical Pharmacist 08/25/2021 9:25 AM

## 2021-08-25 NOTE — Progress Notes (Signed)
Daily Progress Note   Patient Name: Brandi Aguirre       Date: 08/25/2021 DOB: [birth year]  Age: 88 y.o. MRN#: [id number] Attending Physician: Brandi Hanlon, MD Primary Care Physician: Brandi Castle, MD Admit Date: 08/19/2021 Length of Stay: 6 days  Reason for Consultation/Follow-up: Establishing goals of care  HPI/Patient Profile:  88 y.o. female  with past medical history of A-fib on coumadin, hypertension , hard of hearing, PPM placement was brought by EMS after she called her daughter in law this am stating she is sob and cold. Patient found in heart failure and oxygen saturations of 80% 1% on room air on presentation secondary to systolic heart failure and volume overload, elevated BNP she was admitted on 08/19/2021 with acute respiratory failure with hypoxia, acute on chronic systolic heart failure, elevated troponins, A-fib, and others.   Current treatment plan includes diuretics, strict I&O's, daily weights.  PMT was consulted for goals of care conversation given substantial chronic disease burden and advancing age.  Subjective:   Subjective: Chart Reviewed. Updates received. Patient Assessed. Created space and opportunity for patient  and family to explore thoughts and feelings regarding current medical situation.  Today's Discussion: I met with the patient at the bedside, her daughter-in-law was also present.  I communicated with the patient via rhythm whiteboard due to for significant difficulty hearing.  We discussed the current situation that we are continuing to wait for hospice reevaluation and determination on appropriateness for residential hospice as well as availability of a bed.  The patient's family remains hopeful that this will proceed.  We discussed that if hospice does not deem her appropriate, despite our medical opinion, then we can discuss transition to comfort care as an inpatient.  Patient's daughter-in-law expresses gratitude for this.  The patient denies any  pain, nausea, vomiting.  She states overall she feels okay.  At her request I turned on close captioning for her TV.  It appears patient was previously approved for residential hospice, but at this point there are possible second thoughts from hospice on whether she is appropriate for residential care.  See assessment and plan below for further details.  I provided emotional general support through therapeutic listening, empathy, sharing stories, and other techniques.  I answered her questions and addressed all concerns to the best of my ability.  Review of Systems  Constitutional:  Positive for fatigue.  Respiratory:  Positive for shortness of breath (with ambulation). Negative for cough.   Cardiovascular:  Negative for chest pain.  Gastrointestinal:  Negative for abdominal pain, nausea and vomiting.  Neurological:  Positive for weakness.   Objective:   Vital Signs:  BP 130/82 (BP Location: Left Arm)   Pulse 82   Temp 97.8 F (36.6 C)   Resp 20   Ht 5' 5"  (1.651 m)   Wt 51.3 kg   SpO2 98%   BMI 18.82 kg/m   Physical Exam: Physical Exam Vitals and nursing note reviewed.  Constitutional:      General: She is not in acute distress.    Appearance: She is ill-appearing.  HENT:     Head: Normocephalic and atraumatic.     Ears:     Comments: Hard of hearing Pulmonary:     Effort: Pulmonary effort is normal. No respiratory distress.  Abdominal:     General: Abdomen is flat.     Palpations: Abdomen is soft.  Skin:    General: Skin is warm and dry.  Neurological:     Mental  Status: She is alert.  Psychiatric:        Mood and Affect: Mood normal.        Behavior: Behavior normal.    Palliative Assessment/Data: 30-40%   Assessment & Plan:   Impression: Present on Admission: **None**  88 year old female with advancing age, multiple chronic comorbidities, noted decline at home and loss of interest in things that typically brought her joy.  She is admitted now with acute  heart failure with hypoxia, A-fib.  Over the weekend she has had significant change in that she does not want to continue "doing this" and this requested residential hospice.  Given her multiple chronic illnesses, 2 admissions and ED visits since October 2022 I feel this is appropriate.  It is clear the patient and family prefer residential hospice to rehab.  In my opinion, given her significant decompensated state despite ongoing home treatments, if she elects hospice and stops her medications that she will likely have a significant decline shortly related to A-fib, CHF, respiratory status.  Awaiting hospice reevaluation today as well as bed availability.  SUMMARY OF RECOMMENDATIONS   Remain DNR Continue to engage with [facility] on hospice placement Continued patient and family support PMT will continue to follow Consider transition to comfort care inpatient if hospice deems not eligible for residential care  Symptom Management:  Per primary team PMT is available to assist as needed  Code Status: DNR  Prognosis: Unable to determine  Discharge Planning: To Be Determined  Discussed with: Patient, patient's family, TOC team, medical team, nursing team, PT, OT  Thank you for allowing [place] to participate in the care of Brandi Aguirre PMT will continue to support holistically.  Billing based on MDM: High  Problems Addressed: One acute or chronic illness or injury that poses a threat to life or bodily function  Amount and/or Complexity of Data: Category 3:Discussion of management or test interpretation with external physician/other qualified health care professional/appropriate source (not separately reported)  Risks: Decision not to resuscitate or to de-escalate care because of poor prognosis   Brandi Field, NP Palliative Medicine Team  Team Phone # [phone] (Nights/Weekends)  11/25/2020, 8:17 AM

## 2021-08-25 NOTE — TOC Transition Note (Signed)
Transition of Care [facility]) - CM/SW Discharge Note   Patient Details  Name: Mariyanna Mucha MRN: [id number] Date of Birth: 1921-11-21  [facility] CM/SW Contact:  Truddie Hidden, RN Phone Number: 08/25/2021, 2:27 PM   Clinical Narrative:    Patient transferring to [facility] [place]. TOC signing off.      Barriers to Discharge: Continued Medical Work up   Patient Goals and CMS Choice Patient states their goals for this hospitalization and ongoing recovery are:: to go home CMS Medicare.gov Compare Post Acute Care list provided to:: Patient Choice offered to / list presented to : Patient  Discharge Placement                       Discharge Plan and Services                                     Social Determinants of Health (SDOH) Interventions     Readmission Risk Interventions     View : No data to display.

## 2021-08-25 NOTE — Progress Notes (Signed)
Gave report to [facility] RN ).  Patient will be sent to hospice facility with IV in place.   Judeth Cornfield made aware.

## 2021-08-25 NOTE — Progress Notes (Signed)
[facility])   Consent forms are being completed by daughter/Susan.   EMS notified of patient D/C and transport arranged for 5p pick up. TOC/Keona A.. RN/Jennifer S. and Attending Physician/Dr. Marylu Lund also notified of transport arrangement.    Please send signed DNR form with patient and RN call report to [phone].    Odette Fraction, MSW [facility] Liaison [phone]

## 2021-08-25 NOTE — Progress Notes (Signed)
PT Cancellation Note  Patient Details Name: Brandi Aguirre MRN: [id number] DOB: [birth year]   Cancelled Treatment:    Reason Eval/Treat Not Completed: Other (comment). Per discussion with care team, will hold off on therapy session this date. Pending goals of care this date as pt/family is hoping for hospice home. Will continue to follow.   Tranisha Tissue 08/25/2021, 12:16 PM Greggory Stallion, PT, DPT, GCS [phone]

## 2021-08-25 NOTE — Progress Notes (Signed)
OT Cancellation Note  Patient Details Name: Brandi Aguirre MRN: [id number] DOB: 01/25/1922   Cancelled Treatment:    Reason Eval/Treat Not Completed: Other (comment). Per discussion with care team, will hold off on therapy session this date. Pt pending discharge now to hospice facility.   Ardeth Perfect., MPH, MS, OTR/L ascom [phone] 08/25/21, 2:21 PM

## 2021-09-26 DEATH — deceased
